# Patient Record
Sex: Male | Born: 1970 | Race: White | Hispanic: No | Marital: Married | Smoking: Current every day smoker
Health system: Southern US, Community
[De-identification: ages and names within clinical notes are randomized; demographics above are authoritative.]

## PROBLEM LIST (undated history)

## (undated) DIAGNOSIS — F1010 Alcohol abuse, uncomplicated: Secondary | ICD-10-CM

## (undated) DIAGNOSIS — I10 Essential (primary) hypertension: Secondary | ICD-10-CM

## (undated) DIAGNOSIS — K219 Gastro-esophageal reflux disease without esophagitis: Secondary | ICD-10-CM

## (undated) HISTORY — PX: SHOULDER ARTHROSCOPY: SHX128

## (undated) HISTORY — PX: ARTHROSCOPIC REPAIR ACL: SUR80

---

## 2006-12-28 ENCOUNTER — Ambulatory Visit (HOSPITAL_COMMUNITY): Admission: RE | Admit: 2006-12-28 | Discharge: 2006-12-28 | Payer: Self-pay | Admitting: Family Medicine

## 2010-03-22 ENCOUNTER — Ambulatory Visit (HOSPITAL_COMMUNITY): Admission: RE | Admit: 2010-03-22 | Discharge: 2010-03-22 | Payer: Self-pay | Admitting: Family Medicine

## 2012-09-17 ENCOUNTER — Other Ambulatory Visit: Payer: Self-pay | Admitting: Nurse Practitioner

## 2012-09-17 MED ORDER — OMEPRAZOLE 20 MG PO CPDR: 20.0000 mg | DELAYED_RELEASE_CAPSULE | Freq: Every day | ORAL | Status: DC

## 2012-09-17 MED ORDER — CITALOPRAM HYDROBROMIDE 20 MG PO TABS: 20.0000 mg | ORAL_TABLET | Freq: Every day | ORAL | Status: DC

## 2012-09-17 MED ORDER — LOSARTAN POTASSIUM 50 MG PO TABS: 50.0000 mg | ORAL_TABLET | Freq: Every day | ORAL | Status: DC

## 2012-11-27 ENCOUNTER — Other Ambulatory Visit: Payer: Self-pay | Admitting: Nurse Practitioner

## 2012-11-27 NOTE — Telephone Encounter (Signed)
Ok times 4 total 

## 2012-11-27 NOTE — Telephone Encounter (Signed)
RX called into pharmacy

## 2013-01-01 ENCOUNTER — Other Ambulatory Visit: Payer: Self-pay | Admitting: Nurse Practitioner

## 2013-02-20 ENCOUNTER — Other Ambulatory Visit: Payer: Self-pay | Admitting: *Deleted

## 2013-02-20 MED ORDER — OMEPRAZOLE 20 MG PO CPDR: 20.0000 mg | DELAYED_RELEASE_CAPSULE | Freq: Every day | ORAL | Status: DC

## 2013-02-20 MED ORDER — LOSARTAN POTASSIUM 50 MG PO TABS: 50.0000 mg | ORAL_TABLET | Freq: Every day | ORAL | Status: DC

## 2013-03-22 ENCOUNTER — Ambulatory Visit (INDEPENDENT_AMBULATORY_CARE_PROVIDER_SITE_OTHER): Payer: Managed Care, Other (non HMO) | Admitting: Nurse Practitioner

## 2013-03-22 ENCOUNTER — Encounter: Payer: Self-pay | Admitting: Nurse Practitioner

## 2013-03-22 VITALS — BP 150/90 | Ht 70.0 in | Wt 216.0 lb

## 2013-03-22 DIAGNOSIS — K7689 Other specified diseases of liver: Secondary | ICD-10-CM

## 2013-03-22 DIAGNOSIS — R748 Abnormal levels of other serum enzymes: Secondary | ICD-10-CM

## 2013-03-22 DIAGNOSIS — M25569 Pain in unspecified knee: Secondary | ICD-10-CM

## 2013-03-22 DIAGNOSIS — I10 Essential (primary) hypertension: Secondary | ICD-10-CM

## 2013-03-22 DIAGNOSIS — E785 Hyperlipidemia, unspecified: Secondary | ICD-10-CM

## 2013-03-22 DIAGNOSIS — Z23 Encounter for immunization: Secondary | ICD-10-CM

## 2013-03-22 DIAGNOSIS — K760 Fatty (change of) liver, not elsewhere classified: Secondary | ICD-10-CM

## 2013-03-22 DIAGNOSIS — G8929 Other chronic pain: Secondary | ICD-10-CM

## 2013-03-22 MED ORDER — HYDROCODONE-ACETAMINOPHEN 10-325 MG PO TABS: 1.0000 | ORAL_TABLET | Freq: Four times a day (QID) | ORAL | Status: DC | PRN

## 2013-03-25 ENCOUNTER — Encounter: Payer: Self-pay | Admitting: Nurse Practitioner

## 2013-03-25 DIAGNOSIS — G8929 Other chronic pain: Secondary | ICD-10-CM | POA: Insufficient documentation

## 2013-03-25 DIAGNOSIS — E785 Hyperlipidemia, unspecified: Secondary | ICD-10-CM | POA: Insufficient documentation

## 2013-03-25 DIAGNOSIS — I10 Essential (primary) hypertension: Secondary | ICD-10-CM | POA: Insufficient documentation

## 2013-03-25 DIAGNOSIS — R748 Abnormal levels of other serum enzymes: Secondary | ICD-10-CM | POA: Insufficient documentation

## 2013-03-25 DIAGNOSIS — K760 Fatty (change of) liver, not elsewhere classified: Secondary | ICD-10-CM | POA: Insufficient documentation

## 2013-03-25 NOTE — Progress Notes (Signed)
Subjective:  Presents for recheck on his pressure. Has not had his medication today. Was out of his medication for about 2 weeks. Working 60-72 hours per week all summer. No chest pain shortness of breath or edema. Complaints of right knee pain for the past 2 weeks. Began with all the bending and stooping he is doing at work with over time. Has had 2 surgeries on this knee. Pain will wake him up at nighttime. Taking 4 Advil about every 4 hours. Mild edema. Has not had his blood work done in a while. Has not seen a GI specialist for fatty liver disease and elevated liver enzymes.  Objective:   BP 150/90  Ht 5\' 10"  (1.778 m)  Wt 216 lb (97.977 kg)  BMI 30.99 kg/m2 NAD. Alert, oriented. Lungs clear. Heart regular rate rhythm. No murmur or gallop noted. Carotids no bruits or thrills. Lower extremities no edema. Right knee minimal edema. No crepitus noted. No joint laxity. Pain with full extension and flexion. Tenderness noted along the anterior lateral aspect of the knee.   Assessment:Essential hypertension, benign - Plan: Basic metabolic panel  Need for prophylactic vaccination with combined diphtheria-tetanus-pertussis (DTP) vaccine - Plan: Dt vaccine greater than 7yo IM  Fatty liver disease, nonalcoholic - Plan: Hepatic function panel, Lipid panel  Elevated liver enzymes - Plan: Hepatic function panel  Hyperlipemia - Plan: Lipid panel  Knee pain, chronic, right  Plan: Meds ordered this encounter  Medications  . HYDROcodone-acetaminophen (NORCO) 10-325 MG per tablet    Sig: Take 1 tablet by mouth every 6 (six) hours as needed for pain.    Dispense:  30 tablet    Refill:  0    Order Specific Question:  Supervising Provider    Answer:  Merlyn Albert [[id number]]   Since patient's overtime hours are finished, recommend that he call his orthopedic specialist and set up an appointment as soon as possible. Explained that use of narcotic pain medicine is intended for limited time until he can get  appointment for recheck. Followup in 3-4 months, sooner if needed.

## 2013-03-28 ENCOUNTER — Telehealth: Payer: Self-pay | Admitting: Nurse Practitioner

## 2013-03-28 NOTE — Telephone Encounter (Signed)
Patient says he spoke with his insurance and they stated they will cover a 90 day supply of his meds, but since he is down to 3 pills he would like a 30 day refill to [facility] in [place]. He needs losartan, omeprazole, and hydrochlorothiazide.

## 2013-03-29 ENCOUNTER — Other Ambulatory Visit: Payer: Self-pay | Admitting: Nurse Practitioner

## 2013-03-29 ENCOUNTER — Other Ambulatory Visit: Payer: Self-pay

## 2013-03-29 MED ORDER — OMEPRAZOLE 20 MG PO CPDR: 20.0000 mg | DELAYED_RELEASE_CAPSULE | Freq: Every day | ORAL | Status: DC

## 2013-03-29 MED ORDER — CITALOPRAM HYDROBROMIDE 20 MG PO TABS: 20.0000 mg | ORAL_TABLET | Freq: Every day | ORAL | Status: DC

## 2013-03-29 MED ORDER — HYDROCHLOROTHIAZIDE 25 MG PO TABS: ORAL_TABLET | ORAL | Status: DC

## 2013-03-29 MED ORDER — LOSARTAN POTASSIUM 50 MG PO TABS: 50.0000 mg | ORAL_TABLET | Freq: Every day | ORAL | Status: DC

## 2013-03-29 NOTE — Telephone Encounter (Signed)
Need to know what company does his 90 day supply so I can send in order. In the meantime, I will send in 30 d supply as requested.

## 2013-03-29 NOTE — Telephone Encounter (Signed)
Wants printed to mail in

## 2013-04-27 LAB — BASIC METABOLIC PANEL
BUN: 11 mg/dL (ref 6–23)
CO2: 25 mEq/L (ref 19–32)
Calcium: 9.3 mg/dL (ref 8.4–10.5)
Creat: 0.83 mg/dL (ref 0.50–1.35)
Glucose, Bld: 116 mg/dL — ABNORMAL HIGH (ref 70–99)
Sodium: 137 mEq/L (ref 135–145)

## 2013-04-27 LAB — LIPID PANEL
Cholesterol: 190 mg/dL (ref 0–200)
HDL: 34 mg/dL — ABNORMAL LOW (ref >39)

## 2013-04-27 LAB — HEPATIC FUNCTION PANEL
Albumin: 4.1 g/dL (ref 3.5–5.2)
Alkaline Phosphatase: 56 U/L (ref 39–117)
Total Bilirubin: 0.8 mg/dL (ref 0.3–1.2)
Total Protein: 6.7 g/dL (ref 6.0–8.3)

## 2013-05-08 ENCOUNTER — Encounter: Payer: Self-pay | Admitting: Nurse Practitioner

## 2013-05-08 DIAGNOSIS — R7301 Impaired fasting glucose: Secondary | ICD-10-CM | POA: Insufficient documentation

## 2013-05-08 DIAGNOSIS — R7989 Other specified abnormal findings of blood chemistry: Secondary | ICD-10-CM | POA: Insufficient documentation

## 2013-07-04 ENCOUNTER — Other Ambulatory Visit: Payer: Self-pay | Admitting: Nurse Practitioner

## 2013-08-15 ENCOUNTER — Other Ambulatory Visit: Payer: Self-pay | Admitting: Nurse Practitioner

## 2013-08-15 ENCOUNTER — Other Ambulatory Visit: Payer: Self-pay | Admitting: Family Medicine

## 2013-08-16 NOTE — Telephone Encounter (Signed)
It is okay to prove this x3 he will need an office visit before further

## 2013-12-16 ENCOUNTER — Telehealth: Payer: Self-pay | Admitting: Family Medicine

## 2013-12-16 NOTE — Telephone Encounter (Signed)
Rx APPROVED for pt's VIAGRA 50mg  tablet, #8/30days, expires 12/05/2014 through pt's [employer] coverage, faxed approval to [facility]

## 2013-12-25 ENCOUNTER — Other Ambulatory Visit: Payer: Self-pay | Admitting: Family Medicine

## 2013-12-25 NOTE — Telephone Encounter (Signed)
May have 6 refills on both standard medicines in for refills on Xanax

## 2014-05-07 ENCOUNTER — Other Ambulatory Visit: Payer: Self-pay | Admitting: Nurse Practitioner

## 2014-06-23 ENCOUNTER — Other Ambulatory Visit: Payer: Self-pay | Admitting: Family Medicine

## 2014-06-23 NOTE — Telephone Encounter (Signed)
Whoa not seen for fifteen months, can't do also very late on htn f u

## 2014-08-15 ENCOUNTER — Other Ambulatory Visit: Payer: Self-pay | Admitting: Family Medicine

## 2014-09-24 ENCOUNTER — Other Ambulatory Visit: Payer: Self-pay | Admitting: Nurse Practitioner

## 2014-09-24 ENCOUNTER — Other Ambulatory Visit: Payer: Self-pay | Admitting: Family Medicine

## 2014-09-29 ENCOUNTER — Ambulatory Visit (INDEPENDENT_AMBULATORY_CARE_PROVIDER_SITE_OTHER): Payer: Managed Care, Other (non HMO) | Admitting: Family Medicine

## 2014-09-29 ENCOUNTER — Encounter: Payer: Self-pay | Admitting: Family Medicine

## 2014-09-29 VITALS — BP 180/88 | Ht 70.0 in | Wt 206.0 lb

## 2014-09-29 DIAGNOSIS — I10 Essential (primary) hypertension: Secondary | ICD-10-CM

## 2014-09-29 MED ORDER — HYDROCHLOROTHIAZIDE 25 MG PO TABS: ORAL_TABLET | ORAL | Status: DC

## 2014-09-29 MED ORDER — LOSARTAN POTASSIUM 100 MG PO TABS: 100.0000 mg | ORAL_TABLET | Freq: Every day | ORAL | Status: DC

## 2014-09-29 NOTE — Progress Notes (Signed)
   Subjective:    Patient ID: Jamie Chavez, male    DOB: 1971/06/16, 44 y.o.   MRN: [id number]  Hypertension This is a chronic problem. Treatments tried: losartan and hctz.    Patient has had some issues with compliance of medication. Now claims compliance.  Blood pressure was noted to be very high elsewhere and was sent here.  No chest pain no shortness of breath  Review of Systems No vomiting no diarrhea no loss of consciousness    Objective:   Physical Exam Blood pressure on repeat 192/94. HEENT normal. Lungs clear heart regular in rhythm no acute distress ankles without edema       Assessment & Plan:  Impression hypertension poor control question compliance plan increase losartan component. Maintain other meds follow-up as scheduled WSL

## 2014-10-14 ENCOUNTER — Other Ambulatory Visit: Payer: Self-pay | Admitting: Family Medicine

## 2014-10-31 ENCOUNTER — Encounter: Payer: Self-pay | Admitting: Family Medicine

## 2014-10-31 ENCOUNTER — Ambulatory Visit (INDEPENDENT_AMBULATORY_CARE_PROVIDER_SITE_OTHER): Payer: Managed Care, Other (non HMO) | Admitting: Family Medicine

## 2014-10-31 VITALS — BP 164/104 | Ht 70.0 in | Wt 211.5 lb

## 2014-10-31 DIAGNOSIS — I10 Essential (primary) hypertension: Secondary | ICD-10-CM

## 2014-10-31 MED ORDER — HYDROCHLOROTHIAZIDE 25 MG PO TABS: ORAL_TABLET | ORAL | Status: DC

## 2014-10-31 MED ORDER — LOSARTAN POTASSIUM 100 MG PO TABS: 100.0000 mg | ORAL_TABLET | Freq: Every day | ORAL | Status: DC

## 2014-10-31 NOTE — Progress Notes (Signed)
   Subjective:    Patient ID: Jamie Chavez, male    DOB: 14-Nov-1970, 44 y.o.   MRN: [id number]  Hypertension This is a chronic problem. The current episode started more than 1 year ago. The problem has been gradually improving since onset. There are no associated agents to hypertension. There are no known risk factors for coronary artery disease. Treatments tried: losartan, hctz. The current treatment provides moderate improvement. There are no compliance problems.    Patient states that he has joint pain all the time. This has been present for about 6-8 months now. Patient has knots on his knuckles that he is concerned about also.   Out of hctz for five or six days   Pt ck'ed up last night,  Pt has hx of knee surg  Notes nodules on hands concern he may have substantial arthritis.  Some increased stress at work recently      Review of Systems No headache no chest pain no back pain no change in bowel habits    Objective:   Physical Exam  Alert vitals stable lungs clear. Heart regular in rhythm H&T normal. Hands joints normal. No Heberden's nodes. No synovial swelling.     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 Recheck in several months. Diet exercise discussed WSL

## 2014-12-25 ENCOUNTER — Other Ambulatory Visit: Payer: Self-pay | Admitting: Family Medicine

## 2014-12-26 NOTE — Telephone Encounter (Signed)
Omeprazole 6 month, xanax 3 rf

## 2015-01-01 ENCOUNTER — Other Ambulatory Visit: Payer: Self-pay | Admitting: Family Medicine

## 2015-01-02 ENCOUNTER — Telehealth: Payer: Self-pay | Admitting: Family Medicine

## 2015-01-02 NOTE — Telephone Encounter (Signed)
May have this +1 refill 

## 2015-01-02 NOTE — Telephone Encounter (Signed)
Called patient and informed him that I called [facility] and refill for Hydrochlorothiazide is ready for pickup. Also informed patient that Xanax prescription was faxed over to Pharmacy. Patient verbalized understanding.

## 2015-01-02 NOTE — Telephone Encounter (Signed)
hydrochlorothiazide (HYDRODIURIL) 25 MG tablet  Pt needs refill on this, [facility] need a [facility] from  [place] but I don't see anything from them requesting a refill?  Can we fill this as soon as possible, he has been out 2 wks now and his  bp is around 180/158

## 2015-01-09 ENCOUNTER — Telehealth: Payer: Self-pay | Admitting: Family Medicine

## 2015-01-09 NOTE — Telephone Encounter (Signed)
Rx prior auth APPROVED for pt's VIAGRA 50 MG tablet, Ref# [id number], approved for #8/30days, valid 01/09/15-01/09/16, faxed approval to [facility]

## 2015-01-23 ENCOUNTER — Ambulatory Visit: Payer: Managed Care, Other (non HMO) | Admitting: Family Medicine

## 2015-04-03 ENCOUNTER — Other Ambulatory Visit: Payer: Self-pay | Admitting: Family Medicine

## 2015-04-06 NOTE — Telephone Encounter (Signed)
They have this and 2 refills

## 2015-08-11 ENCOUNTER — Other Ambulatory Visit: Payer: Self-pay | Admitting: Family Medicine

## 2015-08-11 NOTE — Telephone Encounter (Signed)
Ok both times one only. ocontact pt, saw in may and rec a three mo f u with high bl pressure, needs o v

## 2015-09-20 ENCOUNTER — Other Ambulatory Visit: Payer: Self-pay | Admitting: Family Medicine

## 2015-09-21 ENCOUNTER — Other Ambulatory Visit: Payer: Self-pay | Admitting: Family Medicine

## 2015-09-21 NOTE — Telephone Encounter (Signed)
Ok times one see other note, needs o v

## 2015-09-21 NOTE — Telephone Encounter (Signed)
Ok times one needs f u o v

## 2015-09-23 ENCOUNTER — Other Ambulatory Visit: Payer: Self-pay | Admitting: Family Medicine

## 2015-09-24 ENCOUNTER — Other Ambulatory Visit: Payer: Self-pay | Admitting: Family Medicine

## 2015-10-05 ENCOUNTER — Encounter: Payer: Self-pay | Admitting: Nurse Practitioner

## 2015-10-05 ENCOUNTER — Ambulatory Visit (INDEPENDENT_AMBULATORY_CARE_PROVIDER_SITE_OTHER): Payer: 59 | Admitting: Nurse Practitioner

## 2015-10-05 VITALS — BP 156/102 | Ht 70.0 in | Wt 208.4 lb

## 2015-10-05 DIAGNOSIS — M25561 Pain in right knee: Secondary | ICD-10-CM | POA: Diagnosis not present

## 2015-10-05 DIAGNOSIS — Z139 Encounter for screening, unspecified: Secondary | ICD-10-CM | POA: Diagnosis not present

## 2015-10-05 DIAGNOSIS — I10 Essential (primary) hypertension: Secondary | ICD-10-CM | POA: Diagnosis not present

## 2015-10-05 DIAGNOSIS — R7301 Impaired fasting glucose: Secondary | ICD-10-CM | POA: Diagnosis not present

## 2015-10-05 DIAGNOSIS — E785 Hyperlipidemia, unspecified: Secondary | ICD-10-CM

## 2015-10-05 DIAGNOSIS — R7989 Other specified abnormal findings of blood chemistry: Secondary | ICD-10-CM | POA: Diagnosis not present

## 2015-10-05 DIAGNOSIS — M2550 Pain in unspecified joint: Secondary | ICD-10-CM

## 2015-10-05 DIAGNOSIS — K760 Fatty (change of) liver, not elsewhere classified: Secondary | ICD-10-CM | POA: Diagnosis not present

## 2015-10-05 DIAGNOSIS — G8929 Other chronic pain: Secondary | ICD-10-CM

## 2015-10-05 MED ORDER — MELOXICAM 15 MG PO TABS: 15.0000 mg | ORAL_TABLET | Freq: Every day | ORAL | Status: DC

## 2015-10-05 NOTE — Progress Notes (Signed)
Subjective:  Presents for routine follow up. Missed BP pills but has taken for the past 2 days. Diet high in sodium. No CP/ischemic type pain or SOB. Smokes 1 1/2  To 2 ppd. Works at [employer]. BP at work runs around 140/90. Stopped Celexa due to fatigue. Defers need for another med. Rare Xanax only at night; has used 3 since getting it filled. Large caffeine intake; also drinks energy drinks. Sees Dr. Jeral FruitBotero for his back pain. Has seen Dr. Terrilee CroakMortensen for chronic right knee pain; has had 2 surgeries. Has been bothering him lately. Also, has nodules on fingers for several months. Had a grandmother with severe arthritis. His sister comes to our office; she was recently diagnosed with liver disease with hemochromatosis. Unable to miss work due to finances.   Objective:   BP 156/102 mmHg  Ht 5\' 10"  (1.778 m)  Wt 208 lb 6 oz (94.518 kg)  BMI 29.90 kg/m2 NAD. Alert, oriented. Lungs clear. Heart RRR. Carotids: no bruits or thrills. Lower extremities no edema. A few nodules noted on joints in fingers, more on right.   Assessment:  Problem List Items Addressed This Visit      Cardiovascular and Mediastinum   Essential hypertension, benign - Primary   Relevant Orders   Basic metabolic panel     Digestive   Fatty liver disease, nonalcoholic   Relevant Orders   Hepatic function panel   Basic metabolic panel     Endocrine   Impaired fasting glucose   Relevant Orders   Lipid panel     Other   Elevated ferritin level   Hyperlipemia   Knee pain, chronic   Relevant Orders   Ferritin    Other Visit Diagnoses    Arthralgia        Relevant Orders    Rheumatoid factor    Antinuclear Antib (ANA)    Screening        Relevant Orders    Hepatitis C Antibody      Plan:  Meds ordered this encounter  Medications  . meloxicam (MOBIC) 15 MG tablet    Sig: Take 1 tablet (15 mg total) by mouth daily. Prn pain    Dispense:  30 tablet    Refill:  0    Please instruct patient not to take with  OTC NSAIDs    Order Specific Question:  Supervising Provider    Answer:  Merlyn AlbertLUKING, WILLIAM S [[id number]]   Labs pending. Lengthy discussion regarding smoking cessation and effects on BP. Does not feel he can quit. Goal is for him to cut back on smoking to at least less than one pack per day. Reduce sodium and caffeine in diet. Get back with orthopedic doctor about right knee pain. Take meds daily as directed.  Return in about 3 months (around 01/04/2016) for BP recheck.

## 2015-10-05 NOTE — Patient Instructions (Signed)
Glucosamine as directed 

## 2015-10-06 ENCOUNTER — Telehealth: Payer: Self-pay | Admitting: Family Medicine

## 2015-10-06 NOTE — Telephone Encounter (Signed)
Form for prior authorization forwarded to you with highlighted area's needing your attention. Please see form and advise?

## 2015-10-06 NOTE — Telephone Encounter (Signed)
Prior authorization form is for patient's viagra.

## 2015-10-07 ENCOUNTER — Encounter: Payer: Self-pay | Admitting: Family Medicine

## 2015-10-11 LAB — BASIC METABOLIC PANEL
BUN/Creatinine Ratio: 10 (ref 9–20)
BUN: 8 mg/dL (ref 6–24)
CALCIUM: 9.6 mg/dL (ref 8.7–10.2)
CO2: 26 mmol/L (ref 18–29)
CREATININE: 0.84 mg/dL (ref 0.76–1.27)
Chloride: 102 mmol/L (ref 96–106)
GFR, EST AFRICAN AMERICAN: 124 mL/min/{1.73_m2} (ref >59)
GFR, EST NON AFRICAN AMERICAN: 107 mL/min/{1.73_m2} (ref >59)
Glucose: 100 mg/dL — ABNORMAL HIGH (ref 65–99)
Potassium: 4.2 mmol/L (ref 3.5–5.2)
Sodium: 144 mmol/L (ref 134–144)

## 2015-10-11 LAB — HEPATIC FUNCTION PANEL
ALBUMIN: 4.8 g/dL (ref 3.5–5.5)
ALT: 86 IU/L — ABNORMAL HIGH (ref 0–44)
AST: 67 IU/L — AB (ref 0–40)
Alkaline Phosphatase: 67 IU/L (ref 39–117)
BILIRUBIN TOTAL: 0.5 mg/dL (ref 0.0–1.2)
Bilirubin, Direct: 0.14 mg/dL (ref 0.00–0.40)
TOTAL PROTEIN: 7.3 g/dL (ref 6.0–8.5)

## 2015-10-11 LAB — LIPID PANEL
CHOL/HDL RATIO: 4.9 ratio (ref 0.0–5.0)
CHOLESTEROL TOTAL: 193 mg/dL (ref 100–199)
HDL: 39 mg/dL — ABNORMAL LOW (ref >39)
LDL CALC: 120 mg/dL — AB (ref 0–99)
Triglycerides: 171 mg/dL — ABNORMAL HIGH (ref 0–149)
VLDL Cholesterol Cal: 34 mg/dL (ref 5–40)

## 2015-10-11 LAB — FERRITIN: Ferritin: 547 ng/mL — ABNORMAL HIGH (ref 30–400)

## 2015-10-11 LAB — RHEUMATOID FACTOR: Rhuematoid fact SerPl-aCnc: <10 IU/mL (ref 0.0–13.9)

## 2015-10-11 LAB — HEPATITIS C ANTIBODY: Hep C Virus Ab: <0.1 s/co ratio (ref 0.0–0.9)

## 2015-10-11 LAB — ANA: ANA: POSITIVE — AB

## 2015-10-14 NOTE — Telephone Encounter (Signed)
done

## 2015-10-15 ENCOUNTER — Encounter: Payer: Self-pay | Admitting: Nurse Practitioner

## 2015-10-22 ENCOUNTER — Telehealth: Payer: Self-pay | Admitting: *Deleted

## 2015-10-22 MED ORDER — TADALAFIL 20 MG PO TABS: ORAL_TABLET | ORAL | Status: DC

## 2015-10-22 NOTE — Telephone Encounter (Signed)
Swith, 20 mg numb 12 ref prn , one two hrs prior to sex

## 2015-10-22 NOTE — Telephone Encounter (Signed)
[facility]

## 2015-10-22 NOTE — Telephone Encounter (Signed)
Pt prescribed viagra. Pt's plan prefers cialis. Do you want to switch med or do you want us to do medical necessity PA. Form is in nurse's basket.

## 2015-10-26 NOTE — Telephone Encounter (Signed)
Left message to return call 

## 2015-10-29 ENCOUNTER — Other Ambulatory Visit: Payer: Self-pay | Admitting: Family Medicine

## 2015-10-29 NOTE — Telephone Encounter (Signed)
May ref times one 

## 2015-10-30 NOTE — Telephone Encounter (Signed)
Left message to return call 

## 2015-11-02 NOTE — Telephone Encounter (Signed)
Left message to return call 

## 2015-11-09 ENCOUNTER — Other Ambulatory Visit: Payer: Self-pay | Admitting: Family Medicine

## 2015-12-23 ENCOUNTER — Other Ambulatory Visit: Payer: Self-pay | Admitting: Family Medicine

## 2015-12-23 ENCOUNTER — Other Ambulatory Visit: Payer: Self-pay | Admitting: Nurse Practitioner

## 2016-01-15 ENCOUNTER — Ambulatory Visit: Payer: 59 | Admitting: Family Medicine

## 2016-01-18 ENCOUNTER — Encounter: Payer: Self-pay | Admitting: Family Medicine

## 2016-01-18 DIAGNOSIS — Z0289 Encounter for other administrative examinations: Secondary | ICD-10-CM

## 2016-01-20 ENCOUNTER — Other Ambulatory Visit: Payer: Self-pay | Admitting: Family Medicine

## 2016-02-25 ENCOUNTER — Telehealth: Payer: Self-pay | Admitting: *Deleted

## 2016-02-25 NOTE — Telephone Encounter (Signed)
Left message to return call to let pt know plan does not cover viagra. See previous telephone note. Med was change to cialis. Left message to see why pt requested viagra.

## 2016-03-04 NOTE — Telephone Encounter (Signed)
Left message to return call 

## 2016-05-01 ENCOUNTER — Other Ambulatory Visit: Payer: Self-pay | Admitting: Family Medicine

## 2016-08-16 ENCOUNTER — Other Ambulatory Visit: Payer: Self-pay | Admitting: Family Medicine

## 2016-08-16 NOTE — Telephone Encounter (Signed)
Call pt, htn visit over due sched and may give 30 no ref

## 2016-08-16 NOTE — Telephone Encounter (Signed)
Patient last seen April 2017 for hypertension visit.

## 2016-10-11 ENCOUNTER — Other Ambulatory Visit: Payer: Self-pay | Admitting: Family Medicine

## 2016-10-12 ENCOUNTER — Other Ambulatory Visit: Payer: Self-pay | Admitting: Family Medicine

## 2016-10-14 ENCOUNTER — Encounter: Payer: Self-pay | Admitting: Nurse Practitioner

## 2016-10-14 NOTE — Telephone Encounter (Signed)
ERROR

## 2016-10-28 ENCOUNTER — Encounter: Payer: Self-pay | Admitting: Nurse Practitioner

## 2016-10-28 ENCOUNTER — Ambulatory Visit (INDEPENDENT_AMBULATORY_CARE_PROVIDER_SITE_OTHER): Payer: 59 | Admitting: Nurse Practitioner

## 2016-10-28 VITALS — BP 158/104 | Ht 70.0 in | Wt 203.6 lb

## 2016-10-28 DIAGNOSIS — I10 Essential (primary) hypertension: Secondary | ICD-10-CM

## 2016-10-28 DIAGNOSIS — K760 Fatty (change of) liver, not elsewhere classified: Secondary | ICD-10-CM

## 2016-10-28 DIAGNOSIS — K219 Gastro-esophageal reflux disease without esophagitis: Secondary | ICD-10-CM

## 2016-10-28 MED ORDER — ALPRAZOLAM 0.5 MG PO TABS: ORAL_TABLET | ORAL | 1 refills | Status: DC

## 2016-10-28 MED ORDER — PANTOPRAZOLE SODIUM 40 MG PO TBEC
40.0000 mg | DELAYED_RELEASE_TABLET | Freq: Every day | ORAL | 1 refills | Status: DC
Start: 2016-10-28 — End: 2017-09-25

## 2016-10-28 MED ORDER — HYDROCHLOROTHIAZIDE 25 MG PO TABS: ORAL_TABLET | ORAL | 1 refills | Status: DC

## 2016-10-28 MED ORDER — LOSARTAN POTASSIUM 100 MG PO TABS: ORAL_TABLET | ORAL | 1 refills | Status: DC

## 2016-10-29 ENCOUNTER — Encounter: Payer: Self-pay | Admitting: Nurse Practitioner

## 2016-10-29 NOTE — Progress Notes (Signed)
Subjective:  Presents for routine follow up. Will be losing his job and insurance within the next couple of months. Has found a new job but may take awhile for insurance to take effect. No CP/ischemic type pain or SOB. No abdominal pain. Has not had GI work up for elevated liver enzymes, fatty liver disease and elevated ferritin level. Limited alcohol intake. Insurance no longer covers omeprazole. Has run out of BP meds. Not on meds today. Defers need for Celexa. Takes rare Xanax.   Objective:   BP (!) 158/104 Comment: has not taken bp meds. ran out  Ht 5\' 10"  (1.778 m)   Wt 203 lb 9.6 oz (92.4 kg)   BMI 29.21 kg/m  NAD. Alert, oriented. Lungs clear. Heart RRR. Abdomen soft, non distended, non tender. LE: no edema.   Assessment:   Problem List Items Addressed This Visit      Cardiovascular and Mediastinum   Essential hypertension, benign - Primary   Relevant Medications   hydrochlorothiazide (HYDRODIURIL) 25 MG tablet   losartan (COZAAR) 100 MG tablet     Digestive   Fatty liver disease, nonalcoholic    Other Visit Diagnoses    Gastroesophageal reflux disease without esophagitis       Relevant Medications   pantoprazole (PROTONIX) 40 MG tablet       Plan:   Meds ordered this encounter  Medications  . ALPRAZolam (XANAX) 0.5 MG tablet    Sig: TAKE ONE-HALF TO ONE TABLET BY MOUTH TWICE DAILY AS NEEDED    Dispense:  60 tablet    Refill:  1    Order Specific Question:   Supervising Provider    Answer:   Merlyn AlbertLUKING, WILLIAM S [[id number]]  . hydrochlorothiazide (HYDRODIURIL) 25 MG tablet    Sig: TAKE ONE TABLET BY MOUTH ONCE DAILY IN THE MORNING    Dispense:  90 tablet    Refill:  1    Order Specific Question:   Supervising Provider    Answer:   Merlyn AlbertLUKING, WILLIAM S [[id number]]  . losartan (COZAAR) 100 MG tablet    Sig: TAKE 1 TABLET BY MOUTH ONCE DAILY    Dispense:  90 tablet    Refill:  1    Needs office visit for additional refills    Order Specific Question:   Supervising Provider   Answer:   Merlyn AlbertLUKING, WILLIAM S [[id number]]  . pantoprazole (PROTONIX) 40 MG tablet    Sig: Take 1 tablet (40 mg total) by mouth daily.    Dispense:  90 tablet    Refill:  1    Order Specific Question:   Supervising Provider    Answer:   Merlyn AlbertLUKING, WILLIAM S [[id number]]   Restart BP meds. Switched to [employer] for insurance coverage. If unable to afford, switch to OTC Omeprazole.  Recommend follow up once he gets new insurance including repeat labs and possible referral to GI. Patient agrees with this plan.

## 2017-03-22 ENCOUNTER — Other Ambulatory Visit: Payer: Self-pay | Admitting: Nurse Practitioner

## 2017-09-05 ENCOUNTER — Telehealth: Payer: Self-pay | Admitting: Family Medicine

## 2017-09-05 ENCOUNTER — Other Ambulatory Visit: Payer: Self-pay | Admitting: *Deleted

## 2017-09-05 MED ORDER — VERAPAMIL HCL ER 240 MG PO TBCR: 240.0000 mg | EXTENDED_RELEASE_TABLET | Freq: Every day | ORAL | 1 refills | Status: DC

## 2017-09-05 NOTE — Telephone Encounter (Signed)
Verapamil 240 sr qd/stap losartan  30 d with one ref, quite late for six mo f u visit before finished

## 2017-09-05 NOTE — Telephone Encounter (Signed)
Pt's LOSARTAN is listed on the [facility] list of the affected manufacturers   Pt wonders if we should renew the Rx or change med? Hasn't taken in about 10 days due to ran out & couldn't refill until got new insurance card  Please advise - OK to LMOVM - pt going to work soon    [facility] [place]

## 2017-09-05 NOTE — Telephone Encounter (Signed)
Left message to return call. New med sent to pharm.

## 2017-09-06 NOTE — Telephone Encounter (Signed)
Discussed with pt. Pt wants to know does he continue taking hctz with the new bp med.

## 2017-09-07 ENCOUNTER — Other Ambulatory Visit: Payer: Self-pay | Admitting: Nurse Practitioner

## 2017-09-07 NOTE — Telephone Encounter (Signed)
Ok times one mo earlier this wk I said needs chronic appt

## 2017-09-07 NOTE — Telephone Encounter (Signed)
Patient is aware of all and states he will call back for an appt.

## 2017-09-07 NOTE — Telephone Encounter (Signed)
Yes, tho visit as requested will help sort this out asw we ck pt on both meds

## 2017-09-07 NOTE — Telephone Encounter (Signed)
Left message to return call 

## 2017-09-21 ENCOUNTER — Telehealth: Payer: Self-pay | Admitting: Family Medicine

## 2017-09-21 ENCOUNTER — Other Ambulatory Visit: Payer: Self-pay | Admitting: Family Medicine

## 2017-09-21 NOTE — Telephone Encounter (Signed)
Nurses-please call his pharmacy find out if valsartan generic is available 160 mg tablet 1 daily?

## 2017-09-21 NOTE — Telephone Encounter (Signed)
It would be wise for this patient to be seen face-to-face I recommend a office visit for tomorrow

## 2017-09-21 NOTE — Telephone Encounter (Signed)
Nurses please talk with the patient if he is interested we can try valsartan or lisinopril in place of the verapamil.  Valsartan and lisinopril are both medications very similar to the losartan.  We have called [facility] and they have both of these available.  If he is interested we can switch to 1 of these medications have him use it in place of verapamil and follow-up with Dr. Brett CanalesSteve next week-patient to let us know

## 2017-09-21 NOTE — Telephone Encounter (Signed)
Spoke with patient; states he is unable to come to office tomorrow due to work. Will get appt to be seen Monday

## 2017-09-21 NOTE — Telephone Encounter (Signed)
Contacted pts pharmacy [facility] and valsartan 160mg  is available. Would you like me to order

## 2017-09-21 NOTE — Telephone Encounter (Signed)
Pt contacted office stating that BP was up. States his BP before taking the med was around 141/89 but with the new med is has increased to around 16/70-170/110. States he takes his BP meds around noon. He is extremely tried and was having headaches but they have eased off. He is taking hydrochlorothiazide 25 and verapamil 240. Please advise.

## 2017-09-22 ENCOUNTER — Other Ambulatory Visit: Payer: Self-pay | Admitting: *Deleted

## 2017-09-22 MED ORDER — VALSARTAN 160 MG PO TABS: 160.0000 mg | ORAL_TABLET | Freq: Every day | ORAL | 2 refills | Status: DC

## 2017-09-22 NOTE — Telephone Encounter (Signed)
Discontinue verapamil.  Start valsartan 160 mg 1 daily, #30, 2 refills.  Please keep follow-up visit.  Please make changes in epic

## 2017-09-22 NOTE — Telephone Encounter (Signed)
Left detailed message on voicemail. Med sent to pharm.

## 2017-09-22 NOTE — Telephone Encounter (Signed)
Spoke with patient and is he willing to try Valsartan. He has an appt Monday with Dr. Brett CanalesSteve. [facility] [place] is pts pharmacy

## 2017-09-22 NOTE — Telephone Encounter (Signed)
Left message to return call 

## 2017-09-25 ENCOUNTER — Ambulatory Visit: Payer: 59 | Admitting: Family Medicine

## 2017-09-25 ENCOUNTER — Encounter: Payer: Self-pay | Admitting: Family Medicine

## 2017-09-25 VITALS — BP 168/88 | Ht 70.0 in | Wt 187.0 lb

## 2017-09-25 DIAGNOSIS — F411 Generalized anxiety disorder: Secondary | ICD-10-CM | POA: Diagnosis not present

## 2017-09-25 DIAGNOSIS — J329 Chronic sinusitis, unspecified: Secondary | ICD-10-CM

## 2017-09-25 DIAGNOSIS — I10 Essential (primary) hypertension: Secondary | ICD-10-CM

## 2017-09-25 MED ORDER — HYDROCHLOROTHIAZIDE 25 MG PO TABS: ORAL_TABLET | ORAL | 1 refills | Status: DC

## 2017-09-25 MED ORDER — BUPROPION HCL ER (SR) 150 MG PO TB12: 150.0000 mg | ORAL_TABLET | Freq: Two times a day (BID) | ORAL | 0 refills | Status: DC

## 2017-09-25 MED ORDER — VALSARTAN 160 MG PO TABS: 160.0000 mg | ORAL_TABLET | Freq: Every day | ORAL | 1 refills | Status: DC

## 2017-09-25 MED ORDER — AMOXICILLIN-POT CLAVULANATE 875-125 MG PO TABS: 1.0000 | ORAL_TABLET | Freq: Two times a day (BID) | ORAL | 0 refills | Status: DC

## 2017-09-25 NOTE — Progress Notes (Signed)
   Subjective:    Patient ID: Jamie Chavez, male    DOB: 31-Jan-1971, 47 y.o.   MRN: [id number]  patient arrives with numerous concerns HPI Patient here today states there was a recall on his Losartan.He was changed to Valsartan 160 mg and states it made bp go up higher. He is taking it.  Blood pressure medicine and blood pressure levels reviewed today with patient. Compliant with blood pressure medicine. States does not miss a dose. No obvious side effects. Blood pressure generally good when checked elsewhere. Watching salt intake.  Intermittent ear fullness 2 months  intermittent nasal congestion productive nasal discharge right ear fullness popping  Progressive anxiety.  A lot of stresses recently.  Lost job last year.  Facing challenges financially.  No suicidal or homicidal thoughts  Review of Systems No headache, no major weight loss or weight gain, no chest pain no back pain abdominal pain no change in bowel habits complete ROS otherwise negative     Objective:   Physical Exam  Alert and oriented, vitals reviewed and stable, NAD ENT-TM's and ext canals WNL bilat via otoscopic exam Soft palate, tonsils and post pharynx WNL via oropharyngeal exam Neck-symmetric, no masses; thyroid nonpalpable and nontender Pulmonary-no tachypnea or accessory muscle use; Clear without wheezes via auscultation Card--no abnrml murmurs, rhythm reg and rate WNL Carotid pulses symmetric, without bruits Impression     Assessment & Plan:  Hypertension.  Discussed at length.  Controlled.  To maintain same medication.  Generalized anxiety disorder.  Uses as needed Xanax.  Would like to consider daily treatment.  Wellbutrin discussed side effects benefits discussed  Subacute rhinosinusitis antibiotics prescribed symptom care discussed

## 2017-11-04 ENCOUNTER — Other Ambulatory Visit: Payer: Self-pay | Admitting: Family Medicine

## 2017-11-06 NOTE — Telephone Encounter (Signed)
Six mo ok 

## 2018-03-26 ENCOUNTER — Ambulatory Visit: Payer: 59 | Admitting: Family Medicine

## 2018-03-26 DIAGNOSIS — Z029 Encounter for administrative examinations, unspecified: Secondary | ICD-10-CM

## 2018-03-27 ENCOUNTER — Ambulatory Visit: Payer: 59 | Admitting: Family Medicine

## 2018-04-17 ENCOUNTER — Encounter: Payer: Self-pay | Admitting: Family Medicine

## 2018-06-30 ENCOUNTER — Other Ambulatory Visit: Payer: Self-pay | Admitting: Family Medicine

## 2018-07-02 NOTE — Telephone Encounter (Signed)
30 d only late for 6 mo ck up, needs o v before further

## 2018-08-22 ENCOUNTER — Other Ambulatory Visit: Payer: Self-pay | Admitting: Family Medicine

## 2018-08-22 NOTE — Telephone Encounter (Signed)
sched appt, thirty days only of meds

## 2018-08-22 NOTE — Telephone Encounter (Signed)
Left message to return call 

## 2018-10-01 ENCOUNTER — Encounter: Payer: Self-pay | Admitting: Family Medicine

## 2018-10-01 ENCOUNTER — Other Ambulatory Visit: Payer: Self-pay

## 2018-10-01 ENCOUNTER — Ambulatory Visit (INDEPENDENT_AMBULATORY_CARE_PROVIDER_SITE_OTHER): Payer: 59 | Admitting: Family Medicine

## 2018-10-01 DIAGNOSIS — F411 Generalized anxiety disorder: Secondary | ICD-10-CM | POA: Diagnosis not present

## 2018-10-01 DIAGNOSIS — I10 Essential (primary) hypertension: Secondary | ICD-10-CM

## 2018-10-01 DIAGNOSIS — F5101 Primary insomnia: Secondary | ICD-10-CM | POA: Diagnosis not present

## 2018-10-01 NOTE — Progress Notes (Addendum)
   Subjective:    Patient ID: Jamie Chavez, male    DOB: 12-06-1970, 48 y.o.   MRN: [id number] Audio visit only Hypertension  This is a chronic problem. Risk factors for coronary artery disease include male gender. Treatments tried: hctz, valsartan. There are no compliance problems.    Blood pressure medicine and blood pressure levels reviewed today with patient. Compliant with blood pressure medicine. States does not miss a dose. No obvious side effects. Blood pressure generally good when checked elsewhere. Watching salt intake.  bp med sticking with it, overall numbers good  Patient continues to have problems with anxiety and depression.  No suicidal thoughts.  Lost his mother and a sister within the last year.  Has gone through divorce.  Currently not involved in another relationship.  Finds his mind focusing on the negatives often.  Wellbutrin just did not help   Review of Systems   Virtual Visit via Video Note  I connected with Jamie Chavez on 10/01/18 at 10:30 AM EDT by a video enabled telemedicine application and verified that I am speaking with the correct person using two identifiers.   I discussed the limitations of evaluation and management by telemedicine and the availability of in person appointments. The patient expressed understanding and agreed to proceed.  History of Present Illness:    Observations/Objective:   Assessment and Plan:   Follow Up Instructions:    I discussed the assessment and treatment plan with the patient. The patient was provided an opportunity to ask questions and all were answered. The patient agreed with the plan and demonstrated an understanding of the instructions.   The patient was advised to call back or seek an in-person evaluation if the symptoms worsen or if the condition fails to improve as anticipated.  I provided25  minutes of non-face-to-face time during this encounter.         Objective:   Physical Exam   Virtual exam      Assessment & Plan:  Impression 1 hypertension.  Continue same meds diet exercise discussed compliance discussed  2.  Depression/anxiety suboptimal result.  Discussed.  Hold on Wellbutrin.  Start Lexapro rationale discussed.  3.  Insomnia ongoing Xanax as needed.  Behavioral modification of negative thoughts discussed  Follow-up in 6 months diet exercise discussed and encouraged No blood work at this time

## 2018-10-08 ENCOUNTER — Telehealth: Payer: Self-pay | Admitting: Family Medicine

## 2018-10-08 MED ORDER — ESCITALOPRAM OXALATE 10 MG PO TABS: 10.0000 mg | ORAL_TABLET | Freq: Every day | ORAL | 5 refills | Status: DC

## 2018-10-08 NOTE — Telephone Encounter (Signed)
Patient was seen 4/8 and was suppose to get a prescription of low dose of lexapro called into [facility]  -[place]

## 2018-10-08 NOTE — Telephone Encounter (Signed)
lexapro 10 one dail six mo worth

## 2018-10-08 NOTE — Telephone Encounter (Signed)
Prescription sent electronically to pharmacy. Patient notified. 

## 2018-10-14 ENCOUNTER — Other Ambulatory Visit: Payer: Self-pay | Admitting: Family Medicine

## 2018-10-15 NOTE — Telephone Encounter (Signed)
Six mo all 

## 2019-05-25 ENCOUNTER — Other Ambulatory Visit: Payer: Self-pay | Admitting: Family Medicine

## 2019-05-27 NOTE — Telephone Encounter (Signed)
Please schedule and then route back to nurses to send in refill °

## 2019-05-27 NOTE — Telephone Encounter (Signed)
One mo, sched six mo ck up

## 2019-05-28 NOTE — Telephone Encounter (Signed)
Lvm to schedule virtual visit  

## 2019-06-05 ENCOUNTER — Other Ambulatory Visit: Payer: Self-pay | Admitting: Family Medicine

## 2019-06-05 NOTE — Telephone Encounter (Signed)
6 mo ok 

## 2019-07-06 ENCOUNTER — Other Ambulatory Visit: Payer: Self-pay | Admitting: Family Medicine

## 2019-07-08 NOTE — Telephone Encounter (Signed)
Please contact patient to set up appt; then may route to nurses. Thank you 

## 2019-07-09 NOTE — Telephone Encounter (Signed)
lvm to schedule virtual   

## 2019-07-10 NOTE — Telephone Encounter (Signed)
lvm to schedule virtual   

## 2019-07-11 NOTE — Telephone Encounter (Signed)
Scheduled 12/6

## 2019-07-16 ENCOUNTER — Other Ambulatory Visit: Payer: Self-pay

## 2019-07-16 ENCOUNTER — Ambulatory Visit (INDEPENDENT_AMBULATORY_CARE_PROVIDER_SITE_OTHER): Payer: 59 | Admitting: Family Medicine

## 2019-07-16 DIAGNOSIS — F5101 Primary insomnia: Secondary | ICD-10-CM

## 2019-07-16 DIAGNOSIS — F411 Generalized anxiety disorder: Secondary | ICD-10-CM | POA: Diagnosis not present

## 2019-07-16 DIAGNOSIS — I10 Essential (primary) hypertension: Secondary | ICD-10-CM | POA: Diagnosis not present

## 2019-07-16 MED ORDER — HYDROCHLOROTHIAZIDE 25 MG PO TABS: ORAL_TABLET | ORAL | 1 refills | Status: DC

## 2019-07-16 MED ORDER — ESCITALOPRAM OXALATE 20 MG PO TABS: ORAL_TABLET | ORAL | 5 refills | Status: DC

## 2019-07-16 MED ORDER — VALSARTAN 160 MG PO TABS: 160.0000 mg | ORAL_TABLET | Freq: Every day | ORAL | 1 refills | Status: DC

## 2019-07-16 MED ORDER — ALPRAZOLAM 0.5 MG PO TABS: ORAL_TABLET | ORAL | 5 refills | Status: DC

## 2019-07-16 NOTE — Progress Notes (Signed)
   Subjective:    Patient ID: Jamie Chavez, male    DOB: 07-11-1970, 49 y.o.   MRN: [id number]  Hypertension This is a chronic problem. Compliance problems: does miss bp med here and there, does a lot of walking at work.    Anxiety and depression. Pt has tried wellbutrin and lexapro. States he stopped both because he felt the same when he was on it.  phq9 and gad 7 done.   Only takes xanax at night.   Virtual Visit via Telephone Note  I connected with [name] on 07/16/19 at 10:30 AM EST by telephone and verified that I am speaking with the correct person using two identifiers.  Location: Patient: home Provider: office   I discussed the limitations, risks, security and privacy concerns of performing an evaluation and management service by telephone and the availability of in person appointments. I also discussed with the patient that there may be a patient responsible charge related to this service. The patient expressed understanding and agreed to proceed.   History of Present Illness:    Observations/Objective:   Assessment and Plan:   Follow Up Instructions:    I discussed the assessment and treatment plan with the patient. The patient was provided an opportunity to ask questions and all were answered. The patient agreed with the plan and demonstrated an understanding of the instructions.   The patient was advised to call back or seek an in-person evaluation if the symptoms worsen or if the condition fails to improve as anticipated.  I provided 20 minutes of non-face-to-face time during this encounter.   Blood pressure medicine and blood pressure levels reviewed today with patient. Compliant with blood pressure medicine. States does not miss a dose. No obvious side effects. Blood pressure generally good when checked elsewhere. Watching salt intake.   Patient notes on 10 mg of Lexapro really did not seem to do much so he basically stopped it.  Still trying to get in  some exercise.  Still ongoing stress with recent personal loss   Review of Systems No headache no chest pain no major shortness of breath    Objective:   Physical Exam  Virtual      Assessment & Plan:  Impression hypertension.  Good control discussed to maintain same  2.  Generalized anxiety disorder.  Suboptimal control discussed with an element of depression also.  Will resume Lexapro but at higher dose rationale discussed follow-up as scheduled

## 2019-07-21 ENCOUNTER — Encounter: Payer: Self-pay | Admitting: Family Medicine

## 2019-07-24 ENCOUNTER — Encounter: Payer: Self-pay | Admitting: Family Medicine

## 2019-08-06 ENCOUNTER — Encounter: Payer: Self-pay | Admitting: Family Medicine

## 2019-08-06 ENCOUNTER — Other Ambulatory Visit: Payer: Self-pay

## 2019-08-06 ENCOUNTER — Ambulatory Visit (HOSPITAL_COMMUNITY)
Admission: RE | Admit: 2019-08-06 | Discharge: 2019-08-06 | Disposition: A | Discharge status: Home / Self Care | Payer: 59 | Source: Ambulatory Visit | Attending: Family Medicine | Admitting: Family Medicine

## 2019-08-06 ENCOUNTER — Ambulatory Visit (INDEPENDENT_AMBULATORY_CARE_PROVIDER_SITE_OTHER): Payer: 59 | Admitting: Family Medicine

## 2019-08-06 VITALS — BP 172/110 | Ht 70.0 in | Wt 205.0 lb

## 2019-08-06 DIAGNOSIS — M25562 Pain in left knee: Secondary | ICD-10-CM | POA: Diagnosis present

## 2019-08-06 MED ORDER — ETODOLAC 400 MG PO TABS: ORAL_TABLET | ORAL | 0 refills | Status: DC

## 2019-08-06 NOTE — Progress Notes (Signed)
   Subjective:    Patient ID: Jamie Chavez, male    DOB: 08/16/1970, 49 y.o.   MRN: [id number]  HPIpt states he was sitting on his porch last night and when he got up he twisted his left knee. Felt like somebody put a knife in his knee when it happened. Walking on crutches today. Taking 4 advil every 3 hours for pain. hadden suden sharp pain left knee last night  May have slipped Or twisted   Now pain very bad intense pain, c=aching     Review of Systems .rs No headache, no major weight loss or weight gain, no chest pain no back pain abdominal pain no change in bowel habits complete ROS otherwise negative     Objective:   Physical Exam Alert vitals stable, NAD. Blood pressure good on repeat. HEENT normal. Lungs clear. Heart regular rate and rhythm. Left knee slight effusion.  Some medial joint line tenderness.  No noticeable joint laxity.  No point tenderness.  Patient using crutches       Assessment & Plan:  Impression knee strain.  Anti-inflammatory medicine prescribed symptom care discussed.  X-ray addendum x-ray showed small calcification no acute injuries.  Patient wished to see if he could recuperate on his own in the coming days before orthopedic referral.  Discussed.

## 2019-08-12 ENCOUNTER — Telehealth: Payer: Self-pay | Admitting: Family Medicine

## 2019-08-12 ENCOUNTER — Other Ambulatory Visit: Payer: Self-pay | Admitting: *Deleted

## 2019-08-12 DIAGNOSIS — M25562 Pain in left knee: Secondary | ICD-10-CM

## 2019-08-12 NOTE — Telephone Encounter (Signed)
Called [facility] and had to leave a message for them to call back to schedule the appt. [phone].

## 2019-08-12 NOTE — Telephone Encounter (Signed)
Called and schedule appt with [facility] [address]. Phone number [phone] with Dr. Clide Dales PA amber for this Thursday 08/15/19 at 9am. Pt called and info given and asked pt to pick up xray images from [facility] to take with him.

## 2019-08-12 NOTE — Telephone Encounter (Signed)
Patient was seen on 2/16 with left knee pain and was discussed a specialist but he wanted to wait a week now he wants to go ahead with referral to specialist as soon as possible. Please advise

## 2019-08-12 NOTE — Telephone Encounter (Signed)
[facility] spcialist maybe have nurse do since acute knee inury

## 2019-10-09 ENCOUNTER — Other Ambulatory Visit (HOSPITAL_COMMUNITY): Payer: Self-pay | Admitting: Specialist

## 2019-10-09 ENCOUNTER — Ambulatory Visit (HOSPITAL_COMMUNITY)
Admission: RE | Admit: 2019-10-09 | Discharge: 2019-10-09 | Disposition: A | Discharge status: Home / Self Care | Payer: 59 | Source: Ambulatory Visit | Attending: Cardiology | Admitting: Cardiology

## 2019-10-09 ENCOUNTER — Other Ambulatory Visit: Payer: Self-pay

## 2019-10-09 DIAGNOSIS — M7989 Other specified soft tissue disorders: Secondary | ICD-10-CM

## 2019-10-09 DIAGNOSIS — M79669 Pain in unspecified lower leg: Secondary | ICD-10-CM | POA: Diagnosis not present

## 2019-10-09 DIAGNOSIS — M79662 Pain in left lower leg: Secondary | ICD-10-CM | POA: Diagnosis not present

## 2019-11-06 ENCOUNTER — Other Ambulatory Visit: Payer: Self-pay | Admitting: Family Medicine

## 2019-12-14 ENCOUNTER — Other Ambulatory Visit: Payer: Self-pay | Admitting: Family Medicine

## 2019-12-16 NOTE — Telephone Encounter (Signed)
Please contact pt to schedule appt, then may route back to nurses. Thank you 

## 2019-12-16 NOTE — Telephone Encounter (Signed)
LVM to schedule appt

## 2019-12-17 NOTE — Telephone Encounter (Signed)
Pt didn't mean to request refill. Didn't realize it was a 90 day supply. He will call back and schedule appt when closer to needing refill.

## 2020-01-20 ENCOUNTER — Telehealth: Payer: Self-pay | Admitting: Family Medicine

## 2020-01-20 NOTE — Telephone Encounter (Signed)
[facility] requesting refill on Xanax 0.5 mg. Take 1/2-1 tablet po BID prn. Pt last seen 08/06/19 for left knee pain. No upcoming appt at this time. Please advise. Thank you

## 2020-01-20 NOTE — Telephone Encounter (Signed)
Please schedule follow up with dr Ladona Ridgel and route back to nurses

## 2020-01-20 NOTE — Telephone Encounter (Signed)
1 refill please pend please follow-up this fall with Dr. Ladona Ridgel

## 2020-01-21 NOTE — Telephone Encounter (Signed)
Left message to schedule appointment

## 2020-01-29 ENCOUNTER — Other Ambulatory Visit: Payer: Self-pay | Admitting: *Deleted

## 2020-01-29 NOTE — Telephone Encounter (Signed)
Scheduled 9/20

## 2020-01-30 MED ORDER — ALPRAZOLAM 0.5 MG PO TABS: ORAL_TABLET | ORAL | 0 refills | Status: DC

## 2020-02-29 ENCOUNTER — Other Ambulatory Visit: Payer: Self-pay | Admitting: Family Medicine

## 2020-03-02 ENCOUNTER — Other Ambulatory Visit: Payer: Self-pay | Admitting: *Deleted

## 2020-03-02 ENCOUNTER — Telehealth: Payer: Self-pay | Admitting: Family Medicine

## 2020-03-02 MED ORDER — HYDROCHLOROTHIAZIDE 25 MG PO TABS: ORAL_TABLET | ORAL | 0 refills | Status: DC

## 2020-03-02 NOTE — Telephone Encounter (Signed)
done

## 2020-03-02 NOTE — Telephone Encounter (Signed)
[facility] requesting refill on HCTZ 25 mg tablet. Take one tablet po in the morning. Pt has appt scheduled on 03/09/20. Please advise. Thank you

## 2020-03-09 ENCOUNTER — Ambulatory Visit: Payer: 59 | Admitting: Family Medicine

## 2020-04-02 ENCOUNTER — Telehealth: Payer: Self-pay

## 2020-04-02 DIAGNOSIS — Z79899 Other long term (current) drug therapy: Secondary | ICD-10-CM

## 2020-04-02 DIAGNOSIS — Z13220 Encounter for screening for lipoid disorders: Secondary | ICD-10-CM

## 2020-04-02 DIAGNOSIS — I10 Essential (primary) hypertension: Secondary | ICD-10-CM

## 2020-04-02 NOTE — Telephone Encounter (Signed)
Patient called because he is running out of medication and needs refills but needs to establish care with Dr. Ladona Ridgel. Patient said he has started a job on first shift and does not get off work until 3:00 in [place] and ABSOLUTELY, ABSOLUTELY, can not be here before 4:00 or 4:15. I told patient that we don't schedule that late and he said that he can't come sooner and will need refills.  Not sure what to do with this patient???????

## 2020-04-03 NOTE — Telephone Encounter (Signed)
Left message to return call 

## 2020-04-03 NOTE — Telephone Encounter (Signed)
Or if he's needing bp meds, just let me know, not sure what he's needing refilled.   D.r Suellyn Meenan

## 2020-04-03 NOTE — Telephone Encounter (Signed)
Pt needs appt for video or phone visit for anxiety medication.  Will not be refilling xanax early,  but pt can have the lexapro refilled till the appt.   Thx.   Dr. Ladona Ridgel

## 2020-04-07 ENCOUNTER — Other Ambulatory Visit: Payer: Self-pay | Admitting: Family Medicine

## 2020-04-07 NOTE — Telephone Encounter (Signed)
Pt returned call. Pt is needing refill on Valsartan and HCTZ and would like 90 day supply sent to [facility]. Pt works an hour away and is available any day after 4pm, prefers 4:15 pm. Pt can do phone visit. Pt no longer taking Lexapro. Please advise. Thank you

## 2020-04-07 NOTE — Telephone Encounter (Signed)
Pt needs to establish care, not seen by me.  Needs recheck of bp in office, last bp was 172 systolic in this office.   Thx.   Dr. Ladona Ridgel

## 2020-04-08 ENCOUNTER — Telehealth: Payer: Self-pay

## 2020-04-08 MED ORDER — VALSARTAN 160 MG PO TABS
160.0000 mg | ORAL_TABLET | Freq: Every day | ORAL | 0 refills | Status: DC
Start: 2020-04-08 — End: 2020-04-15

## 2020-04-08 NOTE — Telephone Encounter (Signed)
Lab orders placed and pt is aware. Pt verbalized understanding. Pt set up appt for 04/15/20 at 4:10 pm

## 2020-04-08 NOTE — Telephone Encounter (Signed)
Can patient make in office appt after 4 pm? Please advise. Thank you

## 2020-04-08 NOTE — Telephone Encounter (Signed)
Pt needs labs, cbc, cmp, lipids, fasting prior to the appt.  Will give small course of medications till can be seen in the next week.  Send me back the date of appt.  4:00 appt. And in person.    Thx.   Dr. Ladona Ridgel

## 2020-04-08 NOTE — Telephone Encounter (Signed)
patient has appointment has appointment on 10/27 and needing labs done

## 2020-04-08 NOTE — Telephone Encounter (Signed)
Orders put in earlier today and pt was notified.

## 2020-04-15 ENCOUNTER — Encounter: Payer: Self-pay | Admitting: Family Medicine

## 2020-04-15 ENCOUNTER — Ambulatory Visit (INDEPENDENT_AMBULATORY_CARE_PROVIDER_SITE_OTHER): Payer: 59 | Admitting: Family Medicine

## 2020-04-15 ENCOUNTER — Other Ambulatory Visit: Payer: Self-pay

## 2020-04-15 VITALS — BP 140/88 | HR 79 | Temp 98.1°F | Ht 70.0 in | Wt 197.4 lb

## 2020-04-15 DIAGNOSIS — M25562 Pain in left knee: Secondary | ICD-10-CM

## 2020-04-15 DIAGNOSIS — D696 Thrombocytopenia, unspecified: Secondary | ICD-10-CM

## 2020-04-15 DIAGNOSIS — I10 Essential (primary) hypertension: Secondary | ICD-10-CM | POA: Diagnosis not present

## 2020-04-15 DIAGNOSIS — G8929 Other chronic pain: Secondary | ICD-10-CM

## 2020-04-15 DIAGNOSIS — F411 Generalized anxiety disorder: Secondary | ICD-10-CM | POA: Diagnosis not present

## 2020-04-15 DIAGNOSIS — Z789 Other specified health status: Secondary | ICD-10-CM | POA: Diagnosis not present

## 2020-04-15 DIAGNOSIS — F1090 Alcohol use, unspecified, uncomplicated: Secondary | ICD-10-CM | POA: Insufficient documentation

## 2020-04-15 LAB — COMPREHENSIVE METABOLIC PANEL
ALT: 66 IU/L — ABNORMAL HIGH (ref 0–44)
AST: 70 IU/L — ABNORMAL HIGH (ref 0–40)
Albumin/Globulin Ratio: 1.7 (ref 1.2–2.2)
Albumin: 4.6 g/dL (ref 4.0–5.0)
Alkaline Phosphatase: 72 IU/L (ref 44–121)
BUN/Creatinine Ratio: 12 (ref 9–20)
BUN: 9 mg/dL (ref 6–24)
Bilirubin Total: 1.1 mg/dL (ref 0.0–1.2)
CO2: 26 mmol/L (ref 20–29)
Calcium: 9.6 mg/dL (ref 8.7–10.2)
Chloride: 102 mmol/L (ref 96–106)
Creatinine, Ser: 0.77 mg/dL (ref 0.76–1.27)
GFR calc Af Amer: 124 mL/min/{1.73_m2} (ref >59)
GFR calc non Af Amer: 107 mL/min/{1.73_m2} (ref >59)
Globulin, Total: 2.7 g/dL (ref 1.5–4.5)
Glucose: 99 mg/dL (ref 65–99)
Potassium: 4.3 mmol/L (ref 3.5–5.2)
Sodium: 142 mmol/L (ref 134–144)
Total Protein: 7.3 g/dL (ref 6.0–8.5)

## 2020-04-15 LAB — CBC WITH DIFFERENTIAL/PLATELET
Basophils Absolute: 0.1 10*3/uL (ref 0.0–0.2)
Basos: 1 %
EOS (ABSOLUTE): 0.2 10*3/uL (ref 0.0–0.4)
Eos: 4 %
Hematocrit: 50.8 % (ref 37.5–51.0)
Hemoglobin: 17.5 g/dL (ref 13.0–17.7)
Immature Grans (Abs): 0 10*3/uL (ref 0.0–0.1)
Immature Granulocytes: 0 %
Lymphocytes Absolute: 2 10*3/uL (ref 0.7–3.1)
Lymphs: 44 %
MCH: 34.8 pg — ABNORMAL HIGH (ref 26.6–33.0)
MCHC: 34.4 g/dL (ref 31.5–35.7)
MCV: 101 fL — ABNORMAL HIGH (ref 79–97)
Monocytes Absolute: 0.4 10*3/uL (ref 0.1–0.9)
Monocytes: 8 %
Neutrophils Absolute: 2 10*3/uL (ref 1.4–7.0)
Neutrophils: 43 %
Platelets: 93 10*3/uL — CL (ref 150–450)
RBC: 5.03 x10E6/uL (ref 4.14–5.80)
RDW: 12.4 % (ref 11.6–15.4)
WBC: 4.7 10*3/uL (ref 3.4–10.8)

## 2020-04-15 LAB — LIPID PANEL
Chol/HDL Ratio: 5.5 ratio — ABNORMAL HIGH (ref 0.0–5.0)
Cholesterol, Total: 203 mg/dL — ABNORMAL HIGH (ref 100–199)
HDL: 37 mg/dL — ABNORMAL LOW (ref >39)
LDL Chol Calc (NIH): 143 mg/dL — ABNORMAL HIGH (ref 0–99)
Triglycerides: 125 mg/dL (ref 0–149)
VLDL Cholesterol Cal: 23 mg/dL (ref 5–40)

## 2020-04-15 MED ORDER — ESCITALOPRAM OXALATE 20 MG PO TABS: ORAL_TABLET | ORAL | 0 refills | Status: DC

## 2020-04-15 MED ORDER — VALSARTAN 40 MG PO TABS: 40.0000 mg | ORAL_TABLET | Freq: Every day | ORAL | 5 refills | Status: DC

## 2020-04-15 MED ORDER — VALSARTAN 160 MG PO TABS: 160.0000 mg | ORAL_TABLET | Freq: Every day | ORAL | 5 refills | Status: DC

## 2020-04-15 NOTE — Progress Notes (Signed)
Patient ID: Jamie Chavez, male    DOB: Oct 13, 1970, 49 y.o.   MRN: [id number]   Chief Complaint  Patient presents with  . Hypertension   Subjective:    HPI  F/u HTN- Pt here for refill on blood pressure medications. Pt has skipped some of med doses. Pt has recently changed shifts at work. Needs refills on HCTZ (90 day supply)   Has h/o chronic knee pain on left- Taking tylenol and aleve for the knee pain.  In 10-19-19 left knee scope 4/21.  bp- Checked it a few days ago 130/85-90.  Pt ran out of the valsartan for a few days.  Kept taking the hctz. Pt stating today he had both his bp meds. bp in room today is very elevated.  Mom died 10/18/17, and father passed in Oct 19, 2019. Pt stating has a lot of anxiety and depression and increased his alcohol intake in past 6 months. Pt was given lexapro in past by last pcp. Pt was supposed to increase from 10mg  lexapro to 20mg  and pt didn't ever take the medication. Pt also was given xanax, and pt continued with this medication.  Pt admits to drinking "a lot of beer." 3-4 beers after work, then weekend 6-7 beers.   Taking omega red tablet for cholesterol.  Medical History Raiyan has no past medical history on file.   Outpatient Encounter Medications as of 04/15/2020  Medication Sig  . ALPRAZolam (XANAX) 0.5 MG tablet TAKE 1/2 TO 1 (ONE-HALF TO ONE) TABLET BY MOUTH TWICE DAILY AS NEEDED  . hydrochlorothiazide (HYDRODIURIL) 25 MG tablet TAKE 1 TABLET BY MOUTH ONCE DAILY IN THE MORNING  . valsartan (DIOVAN) 160 MG tablet Take 1 tablet (160 mg total) by mouth daily. With 40mg  tablet, to equal 200mg  daily.  . [DISCONTINUED] valsartan (DIOVAN) 160 MG tablet Take 1 tablet (160 mg total) by mouth daily.  Jonny Ruiz escitalopram (LEXAPRO) 20 MG tablet Take 1/2 tab p.o. for 7 days and then increase to 1 tab daily.  . valsartan (DIOVAN) 40 MG tablet Take 1 tablet (40 mg total) by mouth daily. Take 1 tablet 40mg  (with 160mg  tablet), p.o. daily.  . [DISCONTINUED]  escitalopram (LEXAPRO) 20 MG tablet Take one tablet po daily  . [DISCONTINUED] etodolac (LODINE) 400 MG tablet Take one po bid with food   No facility-administered encounter medications on file as of 04/15/2020.     Review of Systems  Constitutional: Negative for chills and fever.  HENT: Negative for congestion, rhinorrhea and sore throat.   Respiratory: Negative for cough, shortness of breath and wheezing.   Cardiovascular: Negative for chest pain and leg swelling.  Gastrointestinal: Negative for abdominal pain, diarrhea, nausea and vomiting.  Genitourinary: Negative for dysuria and frequency.  Musculoskeletal:       +left knee pain, chronic  Skin: Negative for rash.  Neurological: Negative for dizziness, weakness and headaches.  Psychiatric/Behavioral: Positive for sleep disturbance. Negative for agitation, behavioral problems, confusion, decreased concentration, dysphoric mood, self-injury and suicidal ideas. The patient is nervous/anxious.      Vitals BP 140/88   Pulse 79   Temp 98.1 F (36.7 C)   Ht 5\' 10"  (1.778 m)   Wt 197 lb 6.4 oz (89.5 kg)   SpO2 100%   BMI 28.32 kg/m   Objective:   Physical Exam Vitals and nursing note reviewed.  Constitutional:      General: He is not in acute distress.    Appearance: Normal appearance. He is not ill-appearing.  HENT:  Head: Normocephalic.     Nose: Nose normal. No congestion.     Mouth/Throat:     Mouth: Mucous membranes are moist.     Pharynx: No oropharyngeal exudate.  Eyes:     Extraocular Movements: Extraocular movements intact.     Conjunctiva/sclera: Conjunctivae normal.     Pupils: Pupils are equal, round, and reactive to light.  Cardiovascular:     Rate and Rhythm: Regular rhythm. Tachycardia present.     Pulses: Normal pulses.     Heart sounds: Normal heart sounds. No murmur heard.   Pulmonary:     Effort: Pulmonary effort is normal. No respiratory distress.     Breath sounds: Normal breath sounds. No  wheezing, rhonchi or rales.  Musculoskeletal:        General: Normal range of motion.     Right lower leg: No edema.     Left lower leg: No edema.  Skin:    General: Skin is warm and dry.     Findings: No rash.  Neurological:     General: No focal deficit present.     Mental Status: He is alert and oriented to person, place, and time.     Cranial Nerves: No cranial nerve deficit.  Psychiatric:        Behavior: Behavior normal.        Thought Content: Thought content normal.        Judgment: Judgment normal.     Comments: +anxious affect      Assessment and Plan   1. Thrombocytopenia (HCC)  2. Essential hypertension, benign - valsartan (DIOVAN) 40 MG tablet; Take 1 tablet (40 mg total) by mouth daily. Take 1 tablet 40mg  (with 160mg  tablet), p.o. daily.  Dispense: 30 tablet; Refill: 5 - valsartan (DIOVAN) 160 MG tablet; Take 1 tablet (160 mg total) by mouth daily. With 40mg  tablet, to equal 200mg  daily.  Dispense: 30 tablet; Refill: 5  3. Generalized anxiety disorder - escitalopram (LEXAPRO) 20 MG tablet; Take 1/2 tab p.o. for 7 days and then increase to 1 tab daily.  Dispense: 30 tablet; Refill: 0  4. Heavy alcohol use  Anxiety/depression- Discussed with pt not able to continue to prescribe the xanax since pt having heavy alcohol use/alcohol use disorder.  Pt needing to work toward decreasing his alcohol intake and try taking lexapro in the meantime. Use melatonin otc for his sleep. Reviewed that continuing to drink large amt of alcohol is worsening depression, anxiety and causing worsening insomnia.  Elevated LFTs and low plts- discussed the concern that his alcohol intake is likely causing some abnormal lab values and affecting his liver.  Advising to cut back on alcohol and discontinue, but not stop abruptly.   htn- suboptimal- on reck his bp is still elevated 150 systolic. Pt given 40mg  and 160mg  valsartan, to equal 200mg  valsartan and cont with 25mg  hctz.  Pt to check bp  daily if seeing numbers over 150/90 call the office. Pt in agreement.  Chronic left knee pain-- aleve, tylenol and ice prn. F/u ortho if cont to have pain.   F/u 4 wk recheck of anxiety and htn.  Recheck labs in 3 months.

## 2020-04-15 NOTE — Patient Instructions (Addendum)
Recommending decrease alcohol intake and smoking to help with your blood pressures.   Will recheck bp on your next visit.   Take lexapro as directed.

## 2020-04-16 ENCOUNTER — Telehealth: Payer: Self-pay | Admitting: Family Medicine

## 2020-04-16 NOTE — Telephone Encounter (Signed)
Left message to return call 

## 2020-04-22 MED ORDER — HYDROCHLOROTHIAZIDE 25 MG PO TABS
25.0000 mg | ORAL_TABLET | Freq: Every day | ORAL | 1 refills | Status: DC
Start: 2020-04-22 — End: 2020-12-09

## 2020-04-22 NOTE — Telephone Encounter (Signed)
Left message to return call 

## 2020-04-22 NOTE — Telephone Encounter (Signed)
Pt contacted and verbalized understanding.   Pt is needing refill of HCTZ sent to [facility]. Please advise. Thank you

## 2020-05-13 ENCOUNTER — Other Ambulatory Visit: Payer: Self-pay

## 2020-05-13 ENCOUNTER — Telehealth: Payer: 59 | Admitting: Family Medicine

## 2020-11-26 ENCOUNTER — Other Ambulatory Visit: Payer: Self-pay | Admitting: Family Medicine

## 2020-11-26 DIAGNOSIS — I10 Essential (primary) hypertension: Secondary | ICD-10-CM

## 2020-11-27 NOTE — Telephone Encounter (Signed)
LVM to Schedule Med Check Appt

## 2020-11-27 NOTE — Telephone Encounter (Signed)
Please schedule appt and then route back

## 2020-12-04 NOTE — Telephone Encounter (Signed)
Sent mychart message

## 2020-12-09 NOTE — Telephone Encounter (Signed)
Patient is needing refill blood pressure medication has been out for a week now has appointment 7/28 with medication follow up

## 2020-12-14 ENCOUNTER — Telehealth: Payer: Self-pay | Admitting: Family Medicine

## 2020-12-14 DIAGNOSIS — I10 Essential (primary) hypertension: Secondary | ICD-10-CM

## 2020-12-14 MED ORDER — VALSARTAN 160 MG PO TABS: 160.0000 mg | ORAL_TABLET | Freq: Every day | ORAL | 0 refills | Status: DC

## 2020-12-14 NOTE — Telephone Encounter (Signed)
Please advise. Thank you

## 2020-12-14 NOTE — Telephone Encounter (Signed)
Left message to return call 

## 2020-12-14 NOTE — Telephone Encounter (Signed)
Patient left voicemail message to follow up on refill request for bp meds. Patient on 2 meds; refill for valsartan (DIOVAN) 160 MG tablet [[id number]]  not called in.   Pharmacy confirmed as   [facility] [address] [place] - [address]  [address] [place], [place] [address]  Phone:  [phone]  Fax:  [phone]   Please advise patient at [phone].

## 2020-12-18 NOTE — Telephone Encounter (Signed)
Pt has appt with Eber Jones on 01/14/21

## 2021-01-14 ENCOUNTER — Other Ambulatory Visit: Payer: Self-pay

## 2021-01-14 ENCOUNTER — Ambulatory Visit (INDEPENDENT_AMBULATORY_CARE_PROVIDER_SITE_OTHER): Payer: 59 | Admitting: Nurse Practitioner

## 2021-01-14 VITALS — BP 132/87 | Wt 200.6 lb

## 2021-01-14 DIAGNOSIS — I10 Essential (primary) hypertension: Secondary | ICD-10-CM | POA: Diagnosis not present

## 2021-01-14 DIAGNOSIS — R7301 Impaired fasting glucose: Secondary | ICD-10-CM

## 2021-01-14 DIAGNOSIS — F411 Generalized anxiety disorder: Secondary | ICD-10-CM

## 2021-01-14 DIAGNOSIS — K760 Fatty (change of) liver, not elsewhere classified: Secondary | ICD-10-CM | POA: Diagnosis not present

## 2021-01-14 DIAGNOSIS — E785 Hyperlipidemia, unspecified: Secondary | ICD-10-CM

## 2021-01-14 DIAGNOSIS — R7989 Other specified abnormal findings of blood chemistry: Secondary | ICD-10-CM

## 2021-01-14 MED ORDER — VALSARTAN 40 MG PO TABS: ORAL_TABLET | ORAL | 1 refills | Status: DC

## 2021-01-14 MED ORDER — VALSARTAN 160 MG PO TABS: 160.0000 mg | ORAL_TABLET | Freq: Every day | ORAL | 1 refills | Status: DC

## 2021-01-14 MED ORDER — SERTRALINE HCL 50 MG PO TABS: 50.0000 mg | ORAL_TABLET | Freq: Every day | ORAL | 0 refills | Status: DC

## 2021-01-14 MED ORDER — HYDROCHLOROTHIAZIDE 25 MG PO TABS: 25.0000 mg | ORAL_TABLET | Freq: Every day | ORAL | 1 refills | Status: DC

## 2021-01-14 NOTE — Progress Notes (Signed)
Subjective:    Patient ID: Jamie Chavez, male    DOB: 1970-08-24, 50 y.o.   MRN: [id number]  HPI Patient arrives for a follow up on blood pressure.  BP on losartan 200 mg total dose has greatly improved.  Has pictures of his monitor on his phone, to most recent BP 132/87 and 109/75.  Denies any chest pain/ischemic type pain or unusual shortness of breath.  No edema.  Patient has stopped escitalopram 20 mg, did not feel it was helping his anxiety.  Still struggling with major losses in his family including both parents and his sister over the past few years.  Denies suicidal or homicidal thoughts or ideation.  Has been having some GI symptoms including occasional burping with some regurgitation.  No burning or pain in the chest or abdominal area.  Drinks 1-2 [employer] per day.  No NSAID use.  Eats a lot of spicy and citrusy foods.  Has noticed symptoms for the past month.  Takes daily omeprazole which has completely controlled his symptoms.  No diarrhea constipation or change in the color of his stools. At 1 point was drinking very heavily, up to a sixpack per day.  Patient has cut back to 3-4 beers per day at most.  Sometimes less. Has cut back his smoking tobacco from 1-1/2 packs/day to 1 pack/day. FMH: His sister had hemochromatosis with elevated liver enzymes.    Objective:   Physical Exam NAD.  Alert, oriented.  Mildly anxious affect.  Cheerful and smiling.  Making good eye contact.  Dressed appropriately.  Thoughts logical coherent and relevant.  Lungs clear.  Heart regular rate rhythm.  Abdomen mildly obese soft nondistended with active bowel sounds x4; no abdominal tenderness noted on exam.  No obvious masses. Today's Vitals   01/14/21 1520  BP: 132/87  Weight: 200 lb 9.6 oz (91 kg)   Body mass index is 28.78 kg/m.       Assessment & Plan:   Problem List Items Addressed This Visit       Cardiovascular and Mediastinum   Essential hypertension, benign - Primary   Relevant  Medications   valsartan (DIOVAN) 160 MG tablet   valsartan (DIOVAN) 40 MG tablet   hydrochlorothiazide (HYDRODIURIL) 25 MG tablet   Other Relevant Orders   CBC with Differential/Platelet   Comprehensive metabolic panel     Digestive   Fatty liver disease, nonalcoholic   Relevant Orders   CBC with Differential/Platelet   Comprehensive metabolic panel     Endocrine   Impaired fasting glucose   Relevant Orders   CBC with Differential/Platelet   Comprehensive metabolic panel     Other   Elevated ferritin level   Relevant Orders   CBC with Differential/Platelet   Ferritin   Generalized anxiety disorder   Relevant Medications   sertraline (ZOLOFT) 50 MG tablet   Hyperlipemia   Relevant Medications   valsartan (DIOVAN) 160 MG tablet   valsartan (DIOVAN) 40 MG tablet   hydrochlorothiazide (HYDRODIURIL) 25 MG tablet   Other Relevant Orders   Lipid panel      Encourage patient to continue to cut back on tobacco and alcohol use. Routine labs ordered. Meds ordered this encounter  Medications   valsartan (DIOVAN) 160 MG tablet    Sig: Take 1 tablet (160 mg total) by mouth daily. With 40mg  tablet, to equal 200mg  daily.    Dispense:  90 tablet    Refill:  1    Order Specific Question:  Supervising Provider    Answer:   Lilyan Punt A [[id number]]   valsartan (DIOVAN) 40 MG tablet    Sig: TAKE 1 TABLET BY MOUTH ONCE DAILY ALONG WITH 160MG  TO EQUAL 200MG     Dispense:  90 tablet    Refill:  1    Order Specific Question:   Supervising Provider    Answer:   A [[id number]]   hydrochlorothiazide (HYDRODIURIL) 25 MG tablet    Sig: Take 1 tablet (25 mg total) by mouth daily.    Dispense:  90 tablet    Refill:  1    Order Specific Question:   Supervising Provider    Answer:   A [[id number]]   sertraline (ZOLOFT) 50 MG tablet    Sig: Take 1 tablet (50 mg total) by mouth daily.    Dispense:  30 tablet    Refill:  0    Order Specific Question:   Supervising Provider     Answer:   Lilyan Punt A [[id number]]   Continue current medications for blood pressure and continue monitoring. Discussed options regarding his anxiety and stress.  Trial of sertraline 50 mg daily.  Reviewed potential adverse effects.  Discontinue medication and contact office if any problems.  Otherwise plan recheck in 1 month.

## 2021-01-15 ENCOUNTER — Encounter: Payer: Self-pay | Admitting: Nurse Practitioner

## 2021-02-24 ENCOUNTER — Other Ambulatory Visit: Payer: Self-pay | Admitting: Nurse Practitioner

## 2021-04-07 ENCOUNTER — Other Ambulatory Visit: Payer: Self-pay | Admitting: Family Medicine

## 2021-06-04 ENCOUNTER — Telehealth: Payer: Self-pay | Admitting: Nurse Practitioner

## 2021-06-07 NOTE — Telephone Encounter (Signed)
Sent my chart message to schedule appointment 06/07/21

## 2021-07-02 NOTE — Telephone Encounter (Signed)
Left message to schedule appointment 07/02/21 °

## 2021-07-09 NOTE — Telephone Encounter (Signed)
Third try call patient to schedule follow up 07/09/21

## 2021-08-23 ENCOUNTER — Telehealth: Payer: Self-pay | Admitting: Family Medicine

## 2021-08-23 ENCOUNTER — Other Ambulatory Visit: Payer: Self-pay

## 2021-08-23 ENCOUNTER — Ambulatory Visit (INDEPENDENT_AMBULATORY_CARE_PROVIDER_SITE_OTHER): Payer: 59 | Admitting: Family Medicine

## 2021-08-23 DIAGNOSIS — K219 Gastro-esophageal reflux disease without esophagitis: Secondary | ICD-10-CM

## 2021-08-23 MED ORDER — PANTOPRAZOLE SODIUM 40 MG PO TBEC: 40.0000 mg | DELAYED_RELEASE_TABLET | Freq: Two times a day (BID) | ORAL | 0 refills | Status: DC

## 2021-08-23 NOTE — Telephone Encounter (Signed)
Mr. Jamie Chavez, Jamie Chavez are scheduled for a virtual visit with your provider today.   ? ?Just as we do with appointments in the office, we must obtain your consent to participate.  Your consent will be active for this visit and any virtual visit you may have with one of our providers in the next 365 days.   ? ?If you have a MyChart account, I can also send a copy of this consent to you electronically.  All virtual visits are billed to your insurance company just like a traditional visit in the office.  As this is a virtual visit, video technology does not allow for your provider to perform a traditional examination.  This may limit your provider's ability to fully assess your condition.  If your provider identifies any concerns that need to be evaluated in person or the need to arrange testing such as labs, EKG, etc, we will make arrangements to do so.   ? ?Although advances in technology are sophisticated, we cannot ensure that it will always work on either your end or our end.  If the connection with a video visit is poor, we may have to switch to a telephone visit.  With either a video or telephone visit, we are not always able to ensure that we have a secure connection.   I need to obtain your verbal consent now.   Are you willing to proceed with your visit today?  ? ?Jamie Chavez has provided verbal consent on 08/23/2021 for a virtual visit (video or telephone). ? ? ?Marlowe Shores, LPN ?08/19/9516  8:41 PM ?  ?

## 2021-08-23 NOTE — Progress Notes (Addendum)
Virtual Visit via Telephone Note ? ?I connected with Jamie Chavez on 08/23/21 at  1:50 PM EST by telephone and verified that I am speaking with the correct person using two identifiers. ? ?Location: ? ?Patient: Home. ?Provider: Office. ? ?I discussed the limitations, risks, security and privacy concerns of performing an evaluation and management service by telephone and the availability of in person appointments. I also discussed with the patient that there may be a patient responsible charge related to this service. The patient expressed understanding and agreed to proceed. ? ? ?History of Present Illness: ?51 year old male presents with complaints of acid reflux. ? ?Patient states that he has been taking omeprazole.  He has been doing this for quite some time and it usually works well.  However, over the past week it has not been helping his symptoms.  He reports that he feels like he has to belch but is unable to do so.  He has had some dry heaving.  No vomiting.  He states that he is now taking omeprazole 40 mg daily without significant improvement.  Continues to smoke.  Has cut back on his alcohol intake but continues to drink.  Denies abdominal pain.  Denies fever.  No reports of chest pain.  No other complaints or concerns at this time. ? ? ?Observations/Objective: ?Patient speaking in full sentences.  No evidence of respiratory distress. ? ?I have reviewed the patient's most recent laboratory studies.   ? ?Assessment and Plan: ?GERD -worsening. ?Stop omeprazole.  Start Protonix 40 mg twice daily. ? ?Follow Up Instructions: ? ?  ?I discussed the assessment and treatment plan with the patient. The patient was provided an opportunity to ask questions and all were answered. The patient agreed with the plan and demonstrated an understanding of the instructions. ?  ?The patient was advised to call back or seek an in-person evaluation if the symptoms worsen or if the condition fails to improve as anticipated. ? ?I  provided 10 minutes of non-face-to-face time during this encounter. ? ?Jamie Sams, DO  ? ?

## 2021-08-26 ENCOUNTER — Emergency Department (HOSPITAL_COMMUNITY): Payer: 59

## 2021-08-26 ENCOUNTER — Inpatient Hospital Stay (HOSPITAL_COMMUNITY)
Admission: EM | Admit: 2021-08-26 | Discharge: 2021-08-31 | DRG: 432 | Disposition: A | Discharge status: Home / Self Care | Payer: 59 | Attending: Family Medicine | Admitting: Family Medicine

## 2021-08-26 ENCOUNTER — Ambulatory Visit
Admission: EM | Admit: 2021-08-26 | Discharge: 2021-08-26 | Disposition: A | Discharge status: Home / Self Care | Payer: 59

## 2021-08-26 ENCOUNTER — Encounter (HOSPITAL_COMMUNITY): Payer: Self-pay | Admitting: Emergency Medicine

## 2021-08-26 ENCOUNTER — Other Ambulatory Visit: Payer: Self-pay

## 2021-08-26 ENCOUNTER — Encounter: Payer: Self-pay | Admitting: Emergency Medicine

## 2021-08-26 DIAGNOSIS — R195 Other fecal abnormalities: Secondary | ICD-10-CM

## 2021-08-26 DIAGNOSIS — R443 Hallucinations, unspecified: Secondary | ICD-10-CM | POA: Diagnosis not present

## 2021-08-26 DIAGNOSIS — D689 Coagulation defect, unspecified: Secondary | ICD-10-CM | POA: Diagnosis not present

## 2021-08-26 DIAGNOSIS — K7031 Alcoholic cirrhosis of liver with ascites: Secondary | ICD-10-CM | POA: Diagnosis present

## 2021-08-26 DIAGNOSIS — K7011 Alcoholic hepatitis with ascites: Secondary | ICD-10-CM | POA: Diagnosis present

## 2021-08-26 DIAGNOSIS — D539 Nutritional anemia, unspecified: Secondary | ICD-10-CM | POA: Diagnosis present

## 2021-08-26 DIAGNOSIS — E440 Moderate protein-calorie malnutrition: Secondary | ICD-10-CM | POA: Diagnosis present

## 2021-08-26 DIAGNOSIS — I959 Hypotension, unspecified: Secondary | ICD-10-CM | POA: Diagnosis not present

## 2021-08-26 DIAGNOSIS — R162 Hepatomegaly with splenomegaly, not elsewhere classified: Secondary | ICD-10-CM | POA: Diagnosis present

## 2021-08-26 DIAGNOSIS — D6959 Other secondary thrombocytopenia: Secondary | ICD-10-CM | POA: Diagnosis present

## 2021-08-26 DIAGNOSIS — K625 Hemorrhage of anus and rectum: Secondary | ICD-10-CM | POA: Diagnosis present

## 2021-08-26 DIAGNOSIS — I10 Essential (primary) hypertension: Secondary | ICD-10-CM | POA: Diagnosis present

## 2021-08-26 DIAGNOSIS — Z23 Encounter for immunization: Secondary | ICD-10-CM | POA: Diagnosis not present

## 2021-08-26 DIAGNOSIS — K7030 Alcoholic cirrhosis of liver without ascites: Secondary | ICD-10-CM | POA: Diagnosis not present

## 2021-08-26 DIAGNOSIS — D696 Thrombocytopenia, unspecified: Secondary | ICD-10-CM

## 2021-08-26 DIAGNOSIS — F411 Generalized anxiety disorder: Secondary | ICD-10-CM | POA: Diagnosis present

## 2021-08-26 DIAGNOSIS — Z20822 Contact with and (suspected) exposure to covid-19: Secondary | ICD-10-CM | POA: Diagnosis present

## 2021-08-26 DIAGNOSIS — D61818 Other pancytopenia: Secondary | ICD-10-CM | POA: Diagnosis not present

## 2021-08-26 DIAGNOSIS — G9349 Other encephalopathy: Secondary | ICD-10-CM

## 2021-08-26 DIAGNOSIS — E538 Deficiency of other specified B group vitamins: Secondary | ICD-10-CM | POA: Diagnosis present

## 2021-08-26 DIAGNOSIS — R7989 Other specified abnormal findings of blood chemistry: Secondary | ICD-10-CM | POA: Diagnosis not present

## 2021-08-26 DIAGNOSIS — K7682 Hepatic encephalopathy: Secondary | ICD-10-CM | POA: Diagnosis present

## 2021-08-26 DIAGNOSIS — F32A Depression, unspecified: Secondary | ICD-10-CM | POA: Diagnosis present

## 2021-08-26 DIAGNOSIS — N179 Acute kidney failure, unspecified: Secondary | ICD-10-CM

## 2021-08-26 DIAGNOSIS — K7010 Alcoholic hepatitis without ascites: Secondary | ICD-10-CM | POA: Diagnosis not present

## 2021-08-26 DIAGNOSIS — K7040 Alcoholic hepatic failure without coma: Principal | ICD-10-CM | POA: Diagnosis present

## 2021-08-26 DIAGNOSIS — D731 Hypersplenism: Secondary | ICD-10-CM | POA: Diagnosis present

## 2021-08-26 DIAGNOSIS — Y906 Blood alcohol level of 120-199 mg/100 ml: Secondary | ICD-10-CM | POA: Diagnosis present

## 2021-08-26 DIAGNOSIS — G9341 Metabolic encephalopathy: Secondary | ICD-10-CM | POA: Diagnosis not present

## 2021-08-26 DIAGNOSIS — F10231 Alcohol dependence with withdrawal delirium: Secondary | ICD-10-CM | POA: Diagnosis present

## 2021-08-26 DIAGNOSIS — E871 Hypo-osmolality and hyponatremia: Secondary | ICD-10-CM | POA: Diagnosis present

## 2021-08-26 DIAGNOSIS — R42 Dizziness and giddiness: Secondary | ICD-10-CM

## 2021-08-26 DIAGNOSIS — E860 Dehydration: Secondary | ICD-10-CM | POA: Diagnosis present

## 2021-08-26 DIAGNOSIS — R519 Headache, unspecified: Secondary | ICD-10-CM | POA: Diagnosis not present

## 2021-08-26 DIAGNOSIS — F17210 Nicotine dependence, cigarettes, uncomplicated: Secondary | ICD-10-CM | POA: Diagnosis present

## 2021-08-26 DIAGNOSIS — J449 Chronic obstructive pulmonary disease, unspecified: Secondary | ICD-10-CM | POA: Diagnosis present

## 2021-08-26 DIAGNOSIS — R0989 Other specified symptoms and signs involving the circulatory and respiratory systems: Secondary | ICD-10-CM | POA: Diagnosis present

## 2021-08-26 DIAGNOSIS — K760 Fatty (change of) liver, not elsewhere classified: Secondary | ICD-10-CM | POA: Diagnosis present

## 2021-08-26 DIAGNOSIS — K219 Gastro-esophageal reflux disease without esophagitis: Secondary | ICD-10-CM | POA: Diagnosis present

## 2021-08-26 DIAGNOSIS — Z79899 Other long term (current) drug therapy: Secondary | ICD-10-CM

## 2021-08-26 DIAGNOSIS — E785 Hyperlipidemia, unspecified: Secondary | ICD-10-CM | POA: Diagnosis present

## 2021-08-26 DIAGNOSIS — Z6829 Body mass index (BMI) 29.0-29.9, adult: Secondary | ICD-10-CM

## 2021-08-26 HISTORY — DX: Alcohol abuse, uncomplicated: F10.10

## 2021-08-26 HISTORY — DX: Essential (primary) hypertension: I10

## 2021-08-26 HISTORY — DX: Gastro-esophageal reflux disease without esophagitis: K21.9

## 2021-08-26 LAB — CBC
HCT: 31.9 % — ABNORMAL LOW (ref 39.0–52.0)
Hemoglobin: 11.6 g/dL — ABNORMAL LOW (ref 13.0–17.0)
MCH: 37.8 pg — ABNORMAL HIGH (ref 26.0–34.0)
MCHC: 36.4 g/dL — ABNORMAL HIGH (ref 30.0–36.0)
MCV: 103.9 fL — ABNORMAL HIGH (ref 80.0–100.0)
Platelets: 30 10*3/uL — ABNORMAL LOW (ref 150–400)
RBC: 3.07 MIL/uL — ABNORMAL LOW (ref 4.22–5.81)
RDW: 14.6 % (ref 11.5–15.5)
WBC: 6 10*3/uL (ref 4.0–10.5)
nRBC: 0 % (ref 0.0–0.2)

## 2021-08-26 LAB — COMPREHENSIVE METABOLIC PANEL
ALT: 67 U/L — ABNORMAL HIGH (ref 0–44)
AST: 174 U/L — ABNORMAL HIGH (ref 15–41)
Albumin: 3.2 g/dL — ABNORMAL LOW (ref 3.5–5.0)
Alkaline Phosphatase: 86 U/L (ref 38–126)
Anion gap: 21 — ABNORMAL HIGH (ref 5–15)
BUN: 20 mg/dL (ref 6–20)
CO2: 15 mmol/L — ABNORMAL LOW (ref 22–32)
Calcium: 8.9 mg/dL (ref 8.9–10.3)
Chloride: 97 mmol/L — ABNORMAL LOW (ref 98–111)
Creatinine, Ser: 1.35 mg/dL — ABNORMAL HIGH (ref 0.61–1.24)
GFR, Estimated: >60 mL/min (ref >60)
Glucose, Bld: 148 mg/dL — ABNORMAL HIGH (ref 70–99)
Potassium: 4.2 mmol/L (ref 3.5–5.1)
Sodium: 133 mmol/L — ABNORMAL LOW (ref 135–145)
Total Bilirubin: 6.7 mg/dL — ABNORMAL HIGH (ref 0.3–1.2)
Total Protein: 7.8 g/dL (ref 6.5–8.1)

## 2021-08-26 LAB — HEPATITIS PANEL, ACUTE
HCV Ab: NONREACTIVE
Hep A IgM: NONREACTIVE
Hep B C IgM: NONREACTIVE
Hepatitis B Surface Ag: NONREACTIVE

## 2021-08-26 LAB — LIPID PANEL
Cholesterol: 151 mg/dL (ref 0–200)
HDL: 13 mg/dL — ABNORMAL LOW (ref >40)
LDL Cholesterol: 108 mg/dL — ABNORMAL HIGH (ref 0–99)
Total CHOL/HDL Ratio: 11.6 RATIO
Triglycerides: 149 mg/dL (ref <150)
VLDL: 30 mg/dL (ref 0–40)

## 2021-08-26 LAB — PROTIME-INR
INR: 1.5 — ABNORMAL HIGH (ref 0.8–1.2)
Prothrombin Time: 17.8 seconds — ABNORMAL HIGH (ref 11.4–15.2)

## 2021-08-26 LAB — RESP PANEL BY RT-PCR (FLU A&B, COVID) ARPGX2
Influenza A by PCR: NEGATIVE
Influenza B by PCR: NEGATIVE
SARS Coronavirus 2 by RT PCR: NEGATIVE

## 2021-08-26 LAB — TYPE AND SCREEN
ABO/RH(D): A POS
Antibody Screen: NEGATIVE

## 2021-08-26 LAB — LIPASE, BLOOD: Lipase: 60 U/L — ABNORMAL HIGH (ref 11–51)

## 2021-08-26 LAB — URINALYSIS, ROUTINE W REFLEX MICROSCOPIC
Glucose, UA: >=500 mg/dL — AB
Hgb urine dipstick: NEGATIVE
Ketones, ur: 5 mg/dL — AB
Leukocytes,Ua: NEGATIVE
Nitrite: NEGATIVE
Protein, ur: 30 mg/dL — AB
Specific Gravity, Urine: 1.02 (ref 1.005–1.030)
pH: 5 (ref 5.0–8.0)

## 2021-08-26 LAB — ETHANOL: Alcohol, Ethyl (B): 133 mg/dL — ABNORMAL HIGH (ref <10)

## 2021-08-26 LAB — ABO/RH: ABO/RH(D): A POS

## 2021-08-26 LAB — HIV ANTIBODY (ROUTINE TESTING W REFLEX): HIV Screen 4th Generation wRfx: NONREACTIVE

## 2021-08-26 LAB — TROPONIN I (HIGH SENSITIVITY)
Troponin I (High Sensitivity): 9 ng/L (ref <18)
Troponin I (High Sensitivity): 10 ng/L (ref <18)

## 2021-08-26 LAB — POC OCCULT BLOOD, ED: Fecal Occult Bld: POSITIVE — AB

## 2021-08-26 LAB — PHOSPHORUS: Phosphorus: 2.2 mg/dL — ABNORMAL LOW (ref 2.5–4.6)

## 2021-08-26 LAB — MAGNESIUM: Magnesium: 1.2 mg/dL — ABNORMAL LOW (ref 1.7–2.4)

## 2021-08-26 LAB — AMMONIA: Ammonia: 62 umol/L — ABNORMAL HIGH (ref 9–35)

## 2021-08-26 LAB — CBG MONITORING, ED: Glucose-Capillary: 146 mg/dL — ABNORMAL HIGH (ref 70–99)

## 2021-08-26 MED ORDER — PANTOPRAZOLE SODIUM 40 MG IV SOLR
40.0000 mg | Freq: Two times a day (BID) | INTRAVENOUS | Status: DC
  Administered 2021-08-26 – 2021-08-28 (×4): 40 mg via INTRAVENOUS
  Filled 2021-08-26 (×4): qty 10

## 2021-08-26 MED ORDER — THIAMINE HCL 100 MG/ML IJ SOLN
100.0000 mg | Freq: Every day | INTRAMUSCULAR | Status: DC
  Administered 2021-08-26: 18:00:00 100 mg via INTRAVENOUS
  Filled 2021-08-26: qty 2

## 2021-08-26 MED ORDER — PANTOPRAZOLE SODIUM 40 MG IV SOLR
40.0000 mg | Freq: Once | INTRAVENOUS | Status: AC
  Administered 2021-08-26: 14:00:00 40 mg via INTRAVENOUS
  Filled 2021-08-26: qty 10

## 2021-08-26 MED ORDER — THIAMINE HCL 100 MG PO TABS
100.0000 mg | ORAL_TABLET | Freq: Every day | ORAL | Status: DC
  Administered 2021-08-27 – 2021-08-31 (×5): 100 mg via ORAL
  Filled 2021-08-26 (×6): qty 1

## 2021-08-26 MED ORDER — LORAZEPAM 1 MG PO TABS
1.0000 mg | ORAL_TABLET | Freq: Once | ORAL | Status: AC
  Administered 2021-08-26: 18:00:00 1 mg via ORAL
  Filled 2021-08-26: qty 1

## 2021-08-26 MED ORDER — ADULT MULTIVITAMIN W/MINERALS CH
1.0000 | ORAL_TABLET | Freq: Every day | ORAL | Status: DC
  Administered 2021-08-26 – 2021-08-31 (×6): 1 via ORAL
  Filled 2021-08-26 (×6): qty 1

## 2021-08-26 MED ORDER — MAGNESIUM SULFATE 4 GM/100ML IV SOLN
4.0000 g | Freq: Once | INTRAVENOUS | Status: AC
  Administered 2021-08-26: 21:00:00 4 g via INTRAVENOUS
  Filled 2021-08-26: qty 100

## 2021-08-26 MED ORDER — LACTULOSE 10 GM/15ML PO SOLN
30.0000 g | Freq: Three times a day (TID) | ORAL | Status: DC
  Administered 2021-08-26 – 2021-08-31 (×14): 30 g via ORAL
  Filled 2021-08-26 (×13): qty 45

## 2021-08-26 MED ORDER — SODIUM CHLORIDE 0.9 % IV SOLN: INTRAVENOUS | Status: DC

## 2021-08-26 MED ORDER — ESCITALOPRAM OXALATE 10 MG PO TABS
10.0000 mg | ORAL_TABLET | Freq: Every day | ORAL | Status: DC | PRN
  Administered 2021-08-27: 10 mg via ORAL
  Filled 2021-08-26: qty 1

## 2021-08-26 MED ORDER — SODIUM CHLORIDE 0.9 % IV BOLUS
1000.0000 mL | Freq: Once | INTRAVENOUS | Status: AC
  Administered 2021-08-26: 11:00:00 1000 mL via INTRAVENOUS

## 2021-08-26 MED ORDER — FOLIC ACID 1 MG PO TABS
1.0000 mg | ORAL_TABLET | Freq: Every day | ORAL | Status: DC
  Administered 2021-08-26 – 2021-08-31 (×6): 1 mg via ORAL
  Filled 2021-08-26 (×6): qty 1

## 2021-08-26 MED ORDER — SODIUM PHOSPHATES 45 MMOLE/15ML IV SOLN
30.0000 mmol | Freq: Once | INTRAVENOUS | Status: AC
  Administered 2021-08-26: 22:00:00 30 mmol via INTRAVENOUS
  Filled 2021-08-26: qty 10

## 2021-08-26 NOTE — Discharge Instructions (Signed)
Please go to emergency department for further care and management. 

## 2021-08-26 NOTE — ED Provider Triage Note (Signed)
Emergency Medicine Provider Triage Evaluation Note ? ?Jamie Chavez , a 51 y.o. male  was evaluated in triage.  Pt complains of lightheadedness. Report hx of reflux and was out of his medication for nearly 2 weeks.  Started on pantoprazole several days prior but no improvement of reflux.  Now feels lightheadedness, dark stool, nausea.  Denies NSAIDs use but drink drink and smoke regularly ? ?Review of Systems  ?Positive: Indigestion, lightheadedness, dark color stool, acid reflux ?Negative: Fever, cp, abd pain, focal numbness ? ?Physical Exam  ?BP 118/69 (BP Location: Right Arm)   Pulse (!) 101   Temp 98.1 ?F (36.7 ?C) (Oral)   Resp 18   SpO2 97%  ?Gen:   Awake, no distress   ?Resp:  Normal effort  ?MSK:   Moves extremities without difficulty  ?Other:   ? ?Medical Decision Making  ?Medically screening exam initiated at 9:55 AM.  Appropriate orders placed.  Jamie Chavez was informed that the remainder of the evaluation will be completed by another provider, this initial triage assessment does not replace that evaluation, and the importance of remaining in the ED until their evaluation is complete. ? ? ?  ?Fayrene Helper, PA-C ?08/26/21 0957 ? ?

## 2021-08-26 NOTE — ED Notes (Signed)
Patient is being discharged from the Urgent Care and sent to the Emergency Department via pov . Per Kennyth Lose wood, pa, patient is in need of higher level of care due to limited resources at [facility] and patient requests. Patient is aware and verbalizes understanding of plan of care.  ?Vitals:  ? 08/26/21 0854  ?BP: 115/68  ?Pulse: 95  ?Resp: 18  ?Temp: 98.3 ?F (36.8 ?C)  ?SpO2: 100%  ?  ?

## 2021-08-26 NOTE — ED Triage Notes (Signed)
Patient here with complaint of burning in his chest that started after running out of omeprazole several days ago. Patient states he was seen at PCP, placed on pantoprazole but symptoms continue. Patient also reports feeling lightheaded and dizzy. Patient denies pain, is alert, oriented, ambulatory, and in no apparent distress at this time. ?

## 2021-08-26 NOTE — Consult Note (Addendum)
UNASSIGNED CONSULT ? ?Reason for Consult:ETOH cirrhosis ?Referring Physician: [facility] ? ?Jamie Chavez ?HPI: This is a 51 year old male with a PMH of ETOH abuse, GERD, and HTN admitted for confusion, headache, and feeling lightheaded.  Work up in the ER shows that he has a cirrhotic liver and his ETOH level was elevated at 133.  His last drink was yesterday.  Over the past several years he lost his father, mother, and sister.  As a result he accelerated his ETOH with drinking liquor.  He states that his sister died from complications of hemochromatosis.  Last week he reported vomiting severely and he thinks he may have vomited some blood.  He was not sure.  There is notable drop in his platelets from 93 to 30 and his liver enzymes were elevated during this admission:  AST 174, ALT 67, albumin 3.2, and TB 6.7.  There is also evidence of renal insufficiency with a creatinine at 1.35.  His baseline appears to be at .7-.8. ? ?Past Medical History:  ?Diagnosis Date  ? GERD (gastroesophageal reflux disease)   ? Hypertension   ? ? ?Past Surgical History:  ?Procedure Laterality Date  ? ARTHROSCOPIC REPAIR ACL    ? SHOULDER ARTHROSCOPY    ? ? ?Family History  ?Problem Relation Age of Onset  ? Liver disease Sister   ? ? ?Social History:  reports that he has been smoking cigarettes. He has been smoking an average of 1 pack per day. He has never used smokeless tobacco. He reports current alcohol use of about 21.0 standard drinks per week. He reports that he does not use drugs. ? ?Allergies: No Known Allergies ? ?Medications: Scheduled: ?Continuous: ? ?Results for orders placed or performed during the hospital encounter of 08/26/21 (from the past 24 hour(s))  ?CBG monitoring, ED     Status: Abnormal  ? Collection Time: 08/26/21 10:01 AM  ?Result Value Ref Range  ? Glucose-Capillary 146 (H) 70 - 99 mg/dL  ?CBC     Status: Abnormal  ? Collection Time: 08/26/21 10:02 AM  ?Result Value Ref Range  ? WBC 6.0 4.0 - 10.5 K/uL   ? RBC 3.07 (L) 4.22 - 5.81 MIL/uL  ? Hemoglobin 11.6 (L) 13.0 - 17.0 g/dL  ? HCT 31.9 (L) 39.0 - 52.0 %  ? MCV 103.9 (H) 80.0 - 100.0 fL  ? MCH 37.8 (H) 26.0 - 34.0 pg  ? MCHC 36.4 (H) 30.0 - 36.0 g/dL  ? RDW 14.6 11.5 - 15.5 %  ? Platelets 30 (L) 150 - 400 K/uL  ? nRBC 0.0 0.0 - 0.2 %  ?Lipase, blood     Status: Abnormal  ? Collection Time: 08/26/21 10:02 AM  ?Result Value Ref Range  ? Lipase 60 (H) 11 - 51 U/L  ?Comprehensive metabolic panel     Status: Abnormal  ? Collection Time: 08/26/21 10:02 AM  ?Result Value Ref Range  ? Sodium 133 (L) 135 - 145 mmol/L  ? Potassium 4.2 3.5 - 5.1 mmol/L  ? Chloride 97 (L) 98 - 111 mmol/L  ? CO2 15 (L) 22 - 32 mmol/L  ? Glucose, Bld 148 (H) 70 - 99 mg/dL  ? BUN 20 6 - 20 mg/dL  ? Creatinine, Ser 1.35 (H) 0.61 - 1.24 mg/dL  ? Calcium 8.9 8.9 - 10.3 mg/dL  ? Total Protein 7.8 6.5 - 8.1 g/dL  ? Albumin 3.2 (L) 3.5 - 5.0 g/dL  ? AST 174 (H) 15 - 41 U/L  ?  ALT 67 (H) 0 - 44 U/L  ? Alkaline Phosphatase 86 38 - 126 U/L  ? Total Bilirubin 6.7 (H) 0.3 - 1.2 mg/dL  ? GFR, Estimated >60 >60 mL/min  ? Anion gap 21 (H) 5 - 15  ?Troponin I (High Sensitivity)     Status: None  ? Collection Time: 08/26/21 10:37 AM  ?Result Value Ref Range  ? Troponin I (High Sensitivity) 9 <18 ng/L  ?Urinalysis, Routine w reflex microscopic     Status: Abnormal  ? Collection Time: 08/26/21 11:23 AM  ?Result Value Ref Range  ? Color, Urine AMBER (A) YELLOW  ? APPearance HAZY (A) CLEAR  ? Specific Gravity, Urine 1.020 1.005 - 1.030  ? pH 5.0 5.0 - 8.0  ? Glucose, UA >=500 (A) NEGATIVE mg/dL  ? Hgb urine dipstick NEGATIVE NEGATIVE  ? Bilirubin Urine SMALL (A) NEGATIVE  ? Ketones, ur 5 (A) NEGATIVE mg/dL  ? Protein, ur 30 (A) NEGATIVE mg/dL  ? Nitrite NEGATIVE NEGATIVE  ? Leukocytes,Ua NEGATIVE NEGATIVE  ? RBC / HPF 0-5 0 - 5 RBC/hpf  ? WBC, UA 6-10 0 - 5 WBC/hpf  ? Bacteria, UA FEW (A) NONE SEEN  ? Squamous Epithelial / LPF 0-5 0 - 5  ? WBC Clumps PRESENT   ? Mucus PRESENT   ? Hyaline Casts, UA PRESENT   ? Non  Squamous Epithelial 0-5 (A) NONE SEEN  ?Ethanol     Status: Abnormal  ? Collection Time: 08/26/21 12:10 PM  ?Result Value Ref Range  ? Alcohol, Ethyl (B) 133 (H) <10 mg/dL  ?Troponin I (High Sensitivity)     Status: None  ? Collection Time: 08/26/21 12:10 PM  ?Result Value Ref Range  ? Troponin I (High Sensitivity) 10 <18 ng/L  ?POC occult blood, ED     Status: Abnormal  ? Collection Time: 08/26/21 12:30 PM  ?Result Value Ref Range  ? Fecal Occult Bld POSITIVE (A) NEGATIVE  ?  ? ?CT Head Wo Contrast ? ?Result Date: 08/26/2021 ?CLINICAL DATA:  Mental status change. EXAM: CT HEAD WITHOUT CONTRAST TECHNIQUE: Contiguous axial images were obtained from the base of the skull through the vertex without intravenous contrast. RADIATION DOSE REDUCTION: This exam was performed according to the departmental dose-optimization program which includes automated exposure control, adjustment of the mA and/or kV according to patient size and/or use of iterative reconstruction technique. COMPARISON:  None. FINDINGS: Brain: The ventricles are normal in size and configuration. No extra-axial fluid collections are identified. The gray-white differentiation is maintained. No CT findings for acute hemispheric infarction or intracranial hemorrhage. No mass lesions. The brainstem and cerebellum are normal. Vascular: No hyperdense vessels or obvious aneurysm. Skull: No acute skull fracture.  No bone lesion. Sinuses/Orbits: The paranasal sinuses and mastoid air cells are clear. The globes are intact. Other: No scalp lesions, laceration or hematoma. IMPRESSION: No acute intracranial findings or mass lesions. Electronically Signed   By: Marijo Sanes M.D.   On: 08/26/2021 11:05  ? ?US Abdomen Limited RUQ (LIVER/GB) ? ?Result Date: 08/26/2021 ?CLINICAL DATA:  Elevated liver function tests EXAM: ULTRASOUND ABDOMEN LIMITED RIGHT UPPER QUADRANT COMPARISON:  03/22/2010 FINDINGS: Gallbladder: No wall thickening or focal tenderness. No detected calculi,  small volume sludge is possible. Common bile duct: Diameter: 5 mm Liver: Echogenic, heterogeneous, and poorly transmitting echoes. Nodular appearance of the surface is well. Portal vein is patent on color Doppler imaging with normal direction of blood flow towards the liver. IMPRESSION: 1. Cirrhotic appearance of the liver. 2. Cholelithiasis.  Electronically Signed   By: Jorje Guild M.D.   On: 08/26/2021 12:01   ? ?ROS:  As stated above in the HPI otherwise negative. ? ?Blood pressure 133/81, pulse (!) 117, temperature 98.1 ?F (36.7 ?C), temperature source Oral, resp. rate 11, height 5\' 10"  (1.778 m), weight 90.7 kg, SpO2 97 %.   ? ?PE: ?Gen: NAD, Alert and Oriented ?HEENT:  [place]/AT, EOMI ?Neck: Supple, no LAD ?Lungs: CTA Bilaterally ?CV: RRR without M/G/R ?ABD: Soft, NTND, +BS ?Ext: No C/C/E, +asterixis ? ?Assessment/Plan: ?1) ETOH hepatitis. ?2) ETOH cirrhosis. ?3) ETOH abuse. ?4) Thrombocytopenia. ?5) Family history of hemochromatosis with his sister. ?6) Heme positive stool. ?7) Hepatic encephalopathy - mild. ? ? The patient has evidence of an ETOH hepatitis.  He was noted to have hepatosplenomegaly in 2011.  At that time he had fatty liver and it was progressive over the years, especially with his increase in drinking liquor.  In the past he reported only drinking beer.  There is the possibility of hemochromatosis and this needs to be evaluated.  Also, he was noted to be heme positive.  There is a decline in his HGB, but his baseline is unknown.  Further evaluation with an EGD/colonoscopy is necessary to screen for varices and to work up the heme positive stool, but his platelets are too low.  This may increase during this hospitalization or with time.  The procedures can be performed on an outpatient basis in the near future also.  There is a family history of hemochromatosis and he needs to be checked for this issue. ? ?Plan: ?1) Supportive care. ?2) Monitor for signs and symptoms of withdrawal. ?3)  Professional counseling for ETOH abuse. ?4) Check for HFE. ?5) Monitor HGB and platelets. ?6) Lactulose. ?  ? ?Alsie Younes D ?08/26/2021, 3:17 PM  ? ? ?  ?

## 2021-08-26 NOTE — Progress Notes (Signed)
FPTS Brief Progress Note ? ?S: Pt feels better after receiving fluids and medications, he is unsure which of those in particular made him feel better. Amenable to blood transfusion if necessary. Does not feel as anxious as previously.  ? ? ?O: ?BP (!) 145/79   Pulse (!) 115   Temp 98.1 ?F (36.7 ?C) (Oral)   Resp 19   Ht 5\' 10"  (1.778 m)   Wt 90.7 kg   SpO2 97%   BMI 28.70 kg/m?   ?Gen: awake, alert, calm, NAD, appropriate in conversation ?Eyes: EOMI, without nystagmus ?CV: RRR, no murmurs auscultated ?Pulm: CTAB, normal WOB ?Abdomen: soft, nontender, obese abdomen ? ?A/P: ?Electrolyte abnormalities  Alcohol abuse ?Magnesium 1.2, Phosphorus 2.2. CIWA improved from 9>5 after 1mg  ativan. Will continue to monitor. ?-Mag 4g IV ?-Na Phos 66mmol in D5 ?-CIWA q4h, ativan per protocol ? ?Thrombocytopenia ?Amenable to blood transfusion. Type and screen performed. Stable without signs of bleeding at this time.  ?-AM CBC, transfuse plts if <10 or if new signs of acute bleed ? ? ?-continue management per day team ?- Orders reviewed. Labs for AM ordered, which was adjusted as needed.  ? ?Wells Guiles, DO ?08/26/2021, 8:59 PM ?PGY-1, [facility] Night Resident  ?Please page [phone] with questions.  ? ? ?

## 2021-08-26 NOTE — H&P (Addendum)
Family Medicine Teaching [facility] Admission History and Physical Service Pager: [phone]  Patient name: Jamie Chavez Medical record number: [id number] Date of birth: May 27, 1971 Age: 51 y.o. Gender: male  Primary Care Provider: [name], DO Consultants: Gastroenterology Code Status: Full which was confirmed with family if patient unable to confirm   Preferred Emergency Contact: Fonda Kinder (daughter)- [phone],   Chief Complaint: dizziness, diminished appetite, GI discomfort  Assessment and Plan: Jamie Chavez is a 51 y.o. male presenting with dizziness, diminished appetite and GI discomfort, found to have alcoholic hepatitis and cirrhosis on laboratory and imaging studies today. PMH is significant for HTN, GERD, alcohol use, tobacco use, hx depression/anxiety.  Abdominal discomfort likely 2/2 alcoholic hepatitis; alcoholic liver cirrhosis with transaminitis Seen at Urgent Care this AM with acid reflux, dizziness, diminished appetite worsening for past 2 weeks, sent to ED for further evaluation.  In ED, hemodynamically stable. RUQ U/S showed cirrhotic appearance of liver with cholelithiasis. CT head negative. Bilirubin elevated to 6.1, AST 174, ALT 67, albumin 3.2, and TB 6.7. Clinical symptoms and imaging/lab studies consistent with alcoholic hepatitis. Also must consider pancreatitis, though not having intense nausea or back pain and lipase only slightly elevated to 60.  Family hx hemochromatosis, apparently sister passed away from complications of this disease. GI consulted and recommended monitoring and supportive care for now with professional ETOH counseling, and monitoring for signs /sx withdrawal. Will rule out other potential causes of cirrhosis including hepatitides. - Admit to FPTS Progressive, admitting Dr. Janit Pagan - GI consulted, appreciate care - Hepatitis panel, Hepatitis C Ab - Avoid hepatotoxic medications - GI recommendation to pursue hemochromatosis (HFE  gene) workup inpatient vs. Outpatient - Iron studies ordered - Regular diet - Vital signs q4h  Dizziness with concern for potential upper GI vs. Lower GI bleed Reports excessive heaving for past several weeks with dark colored stools and dizziness. Hgb 8.5 and FOBT+. Could be secondary to esophageal varices vs. Mallory Weiss tear, possible GI malignancy. GI recommend EGD/colonoscopy to evaluate for varices and heme positive stool, however his platelet count is currently too low to proceed with procedure. Will monitor Hgb/platelets closely. and if appropriate, can pursue procedures while inpatient. - S/p 2 large bore IV's - AM CBC - If any active signs of bleeding (melena, hematochezia) or platelet count < 10k, transfuse platelets - Transfusion threshold Hgb <7.0 for pRBC  AKI Cr elevated to 1.35 on admission. Baseline appears to be 0.7-0.8. Likely secondary to decreased PO intake over past few weeks +/- potential dehydration. - NS mIVF at 125 ml/hr; - Regular diet - AM CMP to monitor Cr  Hypertension Chronic. BP largely within goal <140/90 on admission. Prescribed HCTZ, but does not take it as he ran out. Takes 200mg  Valsartan daily. - Monitor VS - Lipid panel - Hold Valsartan in setting of AKI and potential GI bleed - AM CMP  GERD Worsening for past few weeks. Used to take Omeprazole 40mg  daily without improvement, recently prescribed Protonix 40mg  BID. - IV Protonix 40mg  BID - Hold home PO Protonix   Severe alcohol use, tobacco use Reports last drink last night around 9pm. Drinks four 12 oz beers and ten 2 oz shots per day. Denies history of alcohol withdrawal, seizures or DT. Reports he has never tried to quit drinking in the past, but does say he'd be interested in quitting now. Does not appear to be in withdrawal currently, especially given alcohol level on arrival of 133. - TOC for substance use  resources, smoking cesssation - CIWA protocol -No as needed Ativan at this time  but if sustained elevated CIWA's consider addition of as needed Ativan  Hx depression, anxiety Patient reports multiple family deaths in 2019- lost sister, mother, and father. Struggled with drinking since then. Takes lexapro as needed. Used to take Xanax for anxiety. Denies suicidal ideation but reports feeling very anxious and nervous today, especially in regards to hospitalization. - Educated patient on importance of taking SSRI daily for optimal effects - Resume Lexapro   FEN/GI: Med-Surg Prophylaxis: SCD's bilaterally, no pharmacologic VTE prophylaxis given high-bleed risk  Disposition: Progressive  History of Present Illness:  Jamie Chavez is a 51 y.o. male presenting with 2 weeks of GI discomfort (dry heaving, throwing up white colored foam). Also has had 1 week of dark stools.  Denies hematemesis. Has noted darker colored stools and bright red blood per rectum when wiping on occasion, thought it was due to hemorrhoids.  He recently saw his PCP on Monday 3/6 because his Omeprazole was not helping control his symptoms and he was switched to Protonix. Also reports that everything seems to be a yellow/orange tint with vision.  Smokes 1 PPD. Reports drinking 3-4 beers and 10 shots daily. When asked how long he has been drinking alcohol, he says he has been drinking like that for "too long."  He started drinking heavier in 2019 when he had multiple deaths in his family losing his mother, father and sister. Prescribed Lexapro for anxiety and depression but only takes it intermittently.  Works in a [employer]. Lives alone at home with dogs, cats, and goats. Has a son and daughter.  Review Of Systems: Per HPI with the following additions:   Review of Systems  Constitutional:  Positive for fatigue. Negative for chills and fever.  Eyes:  Positive for visual disturbance (Seeing yellow).  Respiratory:  Negative for chest tightness, shortness of breath and wheezing.   Cardiovascular:   Negative for palpitations and leg swelling.  Gastrointestinal:  Positive for blood in stool, nausea and vomiting. Negative for constipation and diarrhea.  Genitourinary:  Negative for dysuria.  Musculoskeletal:  Negative for arthralgias, myalgias and neck stiffness.  Neurological:  Positive for dizziness and light-headedness. Negative for tremors, weakness and numbness.    Patient Active Problem List   Diagnosis Date Noted   Alcoholic hepatitis 08/26/2021   Heavy alcohol use 04/15/2020   Generalized anxiety disorder 09/25/2017   Elevated ferritin level 05/08/2013   Essential hypertension, benign 03/25/2013   Fatty liver disease, nonalcoholic 03/25/2013   Elevated liver enzymes 03/25/2013   Hyperlipemia 03/25/2013    Past Medical History: Past Medical History:  Diagnosis Date   GERD (gastroesophageal reflux disease)    Hypertension     Past Surgical History: Past Surgical History:  Procedure Laterality Date   ARTHROSCOPIC REPAIR ACL     SHOULDER ARTHROSCOPY      Social History: Social History   Tobacco Use   Smoking status: Every Day    Packs/day: 1.00    Types: Cigarettes   Smokeless tobacco: Never  Vaping Use   Vaping Use: Never used  Substance Use Topics   Alcohol use: Yes    Alcohol/week: 21.0 standard drinks    Types: 21 Cans of beer per week    Comment: Drinks 3-4 beers per day   Drug use: Never    Family History: Family History  Problem Relation Age of Onset   Liver disease Sister     Allergies and  Medications: No Known Allergies No current facility-administered medications on file prior to encounter.   Current Outpatient Medications on File Prior to Encounter  Medication Sig Dispense Refill   escitalopram (LEXAPRO) 10 MG tablet Take 10 mg by mouth daily as needed (anxiety).     loperamide (IMODIUM A-D) 2 MG tablet Take 2 mg by mouth daily.     pantoprazole (PROTONIX) 40 MG tablet Take 1 tablet (40 mg total) by mouth 2 (two) times daily before a  meal. 180 tablet 0   valsartan (DIOVAN) 160 MG tablet Take 1 tablet (160 mg total) by mouth daily. With 40mg  tablet, to equal 200mg  daily. 90 tablet 1   valsartan (DIOVAN) 40 MG tablet TAKE 1 TABLET BY MOUTH ONCE DAILY ALONG WITH 160MG  TO EQUAL 200MG  (Patient taking differently: Take 40 mg by mouth daily. TAKE WITH 160MG  TO EQUAL 200MG ) 90 tablet 1   hydrochlorothiazide (HYDRODIURIL) 25 MG tablet Take 1 tablet (25 mg total) by mouth daily. 90 tablet 1    Objective: BP (!) 162/81    Pulse (!) 118    Temp 98.1 F (36.7 C) (Oral)    Resp 16    Ht 5\' 10"  (1.778 m)    Wt 90.7 kg    SpO2 95%    BMI 28.70 kg/m  Exam: General: Male appears stated age, in no distress, laying in stretcher comfortably Eyes: Conjunctival injection bilaterally. Pupils PERRLA, EOMI. No scleral icterus. No conjunctival pallor. ENTM: MMM. Decent dentition. Tongue fasciculations noted. No pharyngeal erythema, no palatal patechia. No sublingual pallor. Neck: Normal ROM Cardiovascular: RRR, no murmurs appreciated Respiratory: Normal work of breathing on room air. Lungs clear in all fields with no wheezing or crackles Gastrointestinal: Abdomen obese, Soft, non-tender. No appreciable fluid wave or ascites. Small bruise over right mid-abdomen. Hepatomegaly. MSK: No joint deformities Derm: Skin without jaundice, no rashes or lesions Neuro: Awake, alert, oriented x4. Speech clear and fluent. No focal neurologic deficits Psych: Anxious affect. Pleasant and conversational. Answers questions appropriately.  Labs and Imaging: CBC BMET  Recent Labs  Lab 08/26/21 1002  WBC 6.0  HGB 11.6*  HCT 31.9*  PLT 30*   Recent Labs  Lab 08/26/21 1002  NA 133*  K 4.2  CL 97*  CO2 15*  BUN 20  CREATININE 1.35*  GLUCOSE 148*  CALCIUM 8.9     EKG: My own interpretation (not copied from electronic read) NSR, no acute ST or T wave changes  Darral Dashameron, Marisa, DO 08/26/2021, 5:42 PM PGY-1, [facility] FPTS Intern pager:  [phone], text pages welcome   FPTS Upper-Level Resident Addendum   I have independently interviewed and examined the patient. I have discussed the above with the original author and agree with their documentation. Please see also any attending notes.   Derrel NipVictor Aysha Livecchi, MD PGY-3, [facility] 08/26/2021 6:10 PM  FPTS Service pager: [phone] (text pages welcome through [facility])

## 2021-08-26 NOTE — ED Notes (Signed)
Lab reports adding troponin on to previous lab draw.  ?

## 2021-08-26 NOTE — ED Provider Notes (Signed)
[facility] EMERGENCY DEPARTMENT Provider Note   CSN: [id number] Arrival date & time: 08/26/21  0947     History  Chief Complaint  Patient presents with   Dizziness    Jamie Chavez is a 51 y.o. male.  He has a history of hypertension and GERD.  Said for the last 2 weeks he has had increased reflux symptoms.  His doctor switched his PPI.  He has not been eating or drinking much.  For a few days he has noticed some vision changes especially in the morning.  Today at work he was lightheaded and dizzy.  Coworker took him to [employer] [employer] where they recommended he get a head CT but it was not available at that time so he was discharged.  He denies any current chest pain or shortness of breath.  No abdominal pain.  No numbness or weakness. Daily drinker.   The history is provided by the patient.  Dizziness Quality:  Lightheadedness Severity:  Moderate Onset quality:  Gradual Duration:  3 hours Timing:  Intermittent Progression:  Unchanged Chronicity:  New Relieved by:  None tried Worsened by:  Nothing Ineffective treatments:  None tried Associated symptoms: nausea, vision changes and vomiting   Associated symptoms: no chest pain, no headaches, no hearing loss, no shortness of breath, no syncope and no weakness       Home Medications Prior to Admission medications   Medication Sig Start Date End Date Taking? Authorizing Provider  hydrochlorothiazide (HYDRODIURIL) 25 MG tablet Take 1 tablet (25 mg total) by mouth daily. Patient taking differently: Take 25 mg by mouth daily. Out of this medicine for 2 days 01/14/21   Nilda Simmer, NP  pantoprazole (PROTONIX) 40 MG tablet Take 1 tablet (40 mg total) by mouth 2 (two) times daily before a meal. 08/23/21   Cook, Barnie Del, DO  valsartan (DIOVAN) 160 MG tablet Take 1 tablet (160 mg total) by mouth daily. With 40mg  tablet, to equal 200mg  daily. 01/14/21   Nilda Simmer, NP  valsartan (DIOVAN) 40 MG tablet TAKE 1 TABLET BY  MOUTH ONCE DAILY ALONG WITH 160MG  TO EQUAL 200MG  01/14/21   Nilda Simmer, NP      Allergies    Patient has no known allergies.    Review of Systems   Review of Systems  Constitutional:  Negative for fever.  HENT:  Negative for hearing loss and sore throat.   Eyes:  Positive for visual disturbance.  Respiratory:  Positive for cough. Negative for shortness of breath.   Cardiovascular:  Negative for chest pain and syncope.  Gastrointestinal:  Positive for nausea and vomiting. Negative for abdominal pain.  Genitourinary:  Negative for dysuria.  Musculoskeletal:  Negative for gait problem.  Skin:  Negative for rash.  Neurological:  Positive for dizziness. Negative for weakness, numbness and headaches.   Physical Exam Updated Vital Signs BP 118/69 (BP Location: Right Arm)    Pulse (!) 101    Temp 98.1 F (36.7 C) (Oral)    Resp 18    SpO2 97%  Physical Exam Vitals and nursing note reviewed.  Constitutional:      General: He is not in acute distress.    Appearance: Normal appearance. He is well-developed.  HENT:     Head: Normocephalic and atraumatic.  Eyes:     Extraocular Movements: Extraocular movements intact.     Conjunctiva/sclera: Conjunctivae normal.     Pupils: Pupils are equal, round, and reactive to light.  Funduscopic exam:    Right eye: No hemorrhage or exudate.        Left eye: No hemorrhage or exudate.     Slit lamp exam:    Right eye: Anterior chamber quiet.     Left eye: Anterior chamber quiet.  Cardiovascular:     Rate and Rhythm: Normal rate and regular rhythm.     Heart sounds: No murmur heard. Pulmonary:     Effort: Pulmonary effort is normal. No respiratory distress.     Breath sounds: Normal breath sounds.  Abdominal:     Palpations: Abdomen is soft.     Tenderness: There is no abdominal tenderness.  Musculoskeletal:        General: No swelling.     Cervical back: Neck supple.  Skin:    General: Skin is warm and dry.     Capillary Refill:  Capillary refill takes less than 2 seconds.  Neurological:     General: No focal deficit present.     Mental Status: He is alert.    ED Results / Procedures / Treatments   Labs (all labs ordered are listed, but only abnormal results are displayed) Labs Reviewed  CBC - Abnormal; Notable for the following components:      Result Value   RBC 3.07 (*)    Hemoglobin 11.6 (*)    HCT 31.9 (*)    MCV 103.9 (*)    MCH 37.8 (*)    MCHC 36.4 (*)    Platelets 30 (*)    All other components within normal limits  URINALYSIS, ROUTINE W REFLEX MICROSCOPIC - Abnormal; Notable for the following components:   Color, Urine AMBER (*)    APPearance HAZY (*)    Glucose, UA >=500 (*)    Bilirubin Urine SMALL (*)    Ketones, ur 5 (*)    Protein, ur 30 (*)    Bacteria, UA FEW (*)    Non Squamous Epithelial 0-5 (*)    All other components within normal limits  LIPASE, BLOOD - Abnormal; Notable for the following components:   Lipase 60 (*)    All other components within normal limits  COMPREHENSIVE METABOLIC PANEL - Abnormal; Notable for the following components:   Sodium 133 (*)    Chloride 97 (*)    CO2 15 (*)    Glucose, Bld 148 (*)    Creatinine, Ser 1.35 (*)    Albumin 3.2 (*)    AST 174 (*)    ALT 67 (*)    Total Bilirubin 6.7 (*)    Anion gap 21 (*)    All other components within normal limits  ETHANOL - Abnormal; Notable for the following components:   Alcohol, Ethyl (B) 133 (*)    All other components within normal limits  PROTIME-INR - Abnormal; Notable for the following components:   Prothrombin Time 17.8 (*)    INR 1.5 (*)    All other components within normal limits  LIPID PANEL - Abnormal; Notable for the following components:   HDL 13 (*)    LDL Cholesterol 108 (*)    All other components within normal limits  AMMONIA - Abnormal; Notable for the following components:   Ammonia 62 (*)    All other components within normal limits  CBG MONITORING, ED - Abnormal; Notable for  the following components:   Glucose-Capillary 146 (*)    All other components within normal limits  POC OCCULT BLOOD, ED - Abnormal; Notable for the following components:  Fecal Occult Bld POSITIVE (*)    All other components within normal limits  RESP PANEL BY RT-PCR (FLU A&B, COVID) ARPGX2  HEPATITIS PANEL, ACUTE  HIV ANTIBODY (ROUTINE TESTING W REFLEX)  HEMOCHROMATOSIS DNA-PCR(C282Y,H63D)  COMPREHENSIVE METABOLIC PANEL  CBC  IRON AND TIBC  FERRITIN  TSH  RPR  MAGNESIUM  TYPE AND SCREEN  ABO/RH  TROPONIN I (HIGH SENSITIVITY)  TROPONIN I (HIGH SENSITIVITY)    EKG EKG Interpretation  Date/Time:  Thursday August 26 2021 09:58:25 EST Ventricular Rate:  99 PR Interval:  130 QRS Duration: 88 QT Interval:  370 QTC Calculation: 474 R Axis:   65 Text Interpretation: Normal sinus rhythm Normal ECG No previous ECGs available Confirmed by Aletta Edouard [phone]) on 08/26/2021 10:24:42 AM  Radiology CT Head Wo Contrast  Result Date: 08/26/2021 CLINICAL DATA:  Mental status change. EXAM: CT HEAD WITHOUT CONTRAST TECHNIQUE: Contiguous axial images were obtained from the base of the skull through the vertex without intravenous contrast. RADIATION DOSE REDUCTION: This exam was performed according to the departmental dose-optimization program which includes automated exposure control, adjustment of the mA and/or kV according to patient size and/or use of iterative reconstruction technique. COMPARISON:  None. FINDINGS: Brain: The ventricles are normal in size and configuration. No extra-axial fluid collections are identified. The gray-white differentiation is maintained. No CT findings for acute hemispheric infarction or intracranial hemorrhage. No mass lesions. The brainstem and cerebellum are normal. Vascular: No hyperdense vessels or obvious aneurysm. Skull: No acute skull fracture.  No bone lesion. Sinuses/Orbits: The paranasal sinuses and mastoid air cells are clear. The globes are intact.  Other: No scalp lesions, laceration or hematoma. IMPRESSION: No acute intracranial findings or mass lesions. Electronically Signed   By: Marijo Sanes M.D.   On: 08/26/2021 11:05   US Abdomen Limited RUQ (LIVER/GB)  Result Date: 08/26/2021 CLINICAL DATA:  Elevated liver function tests EXAM: ULTRASOUND ABDOMEN LIMITED RIGHT UPPER QUADRANT COMPARISON:  03/22/2010 FINDINGS: Gallbladder: No wall thickening or focal tenderness. No detected calculi, small volume sludge is possible. Common bile duct: Diameter: 5 mm Liver: Echogenic, heterogeneous, and poorly transmitting echoes. Nodular appearance of the surface is well. Portal vein is patent on color Doppler imaging with normal direction of blood flow towards the liver. IMPRESSION: 1. Cirrhotic appearance of the liver. 2. Cholelithiasis. Electronically Signed   By: Jorje Guild M.D.   On: 08/26/2021 12:01    Procedures Procedures    Medications Ordered in ED Medications  escitalopram (LEXAPRO) tablet 10 mg (has no administration in time range)  0.9 %  sodium chloride infusion ( Intravenous New Bag/Given [id number])  folic acid (FOLVITE) tablet 1 mg (1 mg Oral Given 08/26/21 1731)  multivitamin with minerals tablet 1 tablet (1 tablet Oral Given 08/26/21 1731)  thiamine tablet 100 mg ( Oral See Alternative 08/26/21 1733)    Or  thiamine (B-1) injection 100 mg (100 mg Intravenous Given 08/26/21 1733)  pantoprazole (PROTONIX) injection 40 mg (has no administration in time range)  lactulose (CHRONULAC) 10 GM/15ML solution 30 g (has no administration in time range)  sodium chloride 0.9 % bolus 1,000 mL (0 mLs Intravenous Stopped 08/26/21 1212)  pantoprazole (PROTONIX) injection 40 mg (40 mg Intravenous Given 08/26/21 1421)  LORazepam (ATIVAN) tablet 1 mg (1 mg Oral Given 08/26/21 1816)    ED Course/ Medical Decision Making/ A&P Clinical Course as of 08/26/21 2012  Thu Aug 26, 2021  1110 CT head does not show any acute mass or bleed.  Awaiting radiology reading  [  MB]  1228 Patient states he drinks 3-4 beers a day and 8 or 10 shots.  Has not seen any bleeding.  Rectal exam done with nurse Loma Sousa as chaperone.  Normal tone no masses.  Sample sent to lab for guaiac. [MB]  [id number] Discussed with Dr. Almyra Free from GI.  He agrees with admission to the hospital and he will see in consult [MB]  1414 Discussed with family practice teaching service who will evaluate patient for admission. [MB]    Clinical Course User Index [MB] Hayden Rasmussen, MD                           Medical Decision Making Amount and/or Complexity of Data Reviewed Labs: ordered. Radiology: ordered.  Risk Prescription drug management. Decision regarding hospitalization.  This patient complains of reflux, dizziness lightheadedness, orange vision; this involves an extensive number of treatment Options and is a complaint that carries with it a high risk of complications and morbidity. The differential includes GERD, peptic ulcer disease, gastritis, cholelithiasis, cholecystitis, stroke, bleed, metabolic derangement, dehydration, liver failure  I ordered, reviewed and interpreted labs, which included CBC with normal white count, hemoglobin low from priors, platelets low, chemistries with elevated creatinine elevated LFTs and T. bili Ruben, COVID and flu negative, fecal occult positive.  Acute hepatitis panel and alcohol ordered, alcohol elevated, ammonia mildly elevated I ordered medication IV fluids, IV PPI and reviewed PMP when indicated. I ordered imaging studies which included CT head and right upper quadrant ultrasound and I independently    visualized and interpreted imaging which showed cholelithiasis, concerns for cirrhosis Previous records obtained and reviewed no recent admissions I consulted GI Dr. Almyra Free and family practice teaching service and discussed lab and imaging findings and discussed disposition.  Cardiac monitoring reviewed, sinus tachycardia Social determinants  considered, ongoing alcohol and tobacco abuse Critical Interventions: None  After the interventions stated above, I reevaluated the patient and found patient with clear mental status. 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  Patient in agreement with plan for admission.          Final Clinical Impression(s) / ED Diagnoses Final diagnoses:  LFT elevation  Alcoholic cirrhosis, unspecified whether ascites present ([place])  Dizziness  AKI (acute kidney injury) ([place])  Thrombocytopenia ([facility])  Heme positive stool    Rx / DC Orders ED Discharge Orders     None         Hayden Rasmussen, MD 08/26/21 2016

## 2021-08-26 NOTE — ED Provider Notes (Signed)
? ?[facility] ?Provider Note ? ?Patient Contact: 9:05 AM (approximate) ? ? ?History  ? ?Dizziness ? ? ?HPI ? ?Jamie Chavez is a 51 y.o. male with a history of alcohol use, thrombocytopenia and GERD presents to the urgent care with multiple medical concerns.  Patient states that he is primarily concerned as he has headache, lightheadedness and changes in speech as reported by patient's coworkers.  He states that he feels somewhat confused.  He denies fever, chest pain, chest tightness, nausea, vomiting or abdominal pain.  Denies experiencing similar pain in the past.  Denies falls or mechanisms of trauma. ? ?  ? ? ?Physical Exam  ? ?Triage Vital Signs: ?ED Triage Vitals  ?Enc Vitals Group  ?   BP 08/26/21 0854 115/68  ?   Pulse Rate 08/26/21 0854 95  ?   Resp 08/26/21 0854 18  ?   Temp 08/26/21 0854 98.3 ?F (36.8 ?C)  ?   Temp Source 08/26/21 0854 Oral  ?   SpO2 08/26/21 0854 100 %  ?   Weight --   ?   Height --   ?   Head Circumference --   ?   Peak Flow --   ?   Pain Score 08/26/21 0849 0  ?   Pain Loc --   ?   Pain Edu? --   ?   Excl. in GC? --   ? ? ?Most recent vital signs: ?Vitals:  ? 08/26/21 0854  ?BP: 115/68  ?Pulse: 95  ?Resp: 18  ?Temp: 98.3 ?F (36.8 ?C)  ?SpO2: 100%  ? ? ? ?General: Alert and in no acute distress. ?Eyes:  PERRL. EOMI. ?Head: No acute traumatic findings ?ENT: ?     Nose: No congestion/rhinnorhea. ?     Mouth/Throat: Mucous membranes are moist. ?Neck: No stridor. No cervical spine tenderness to palpation. ?Cardiovascular:  Good peripheral perfusion ?Respiratory: Normal respiratory effort without tachypnea or retractions. Lungs CTAB. Good air entry to the bases with no decreased or absent breath sounds. ?Gastrointestinal: Bowel sounds ?4 quadrants. Soft and nontender to palpation. No guarding or rigidity. No palpable masses. No distention. No CVA tenderness. ?Musculoskeletal: Patient has symmetric strength in the upper and lower extremities.  Full range of motion to  all extremities.  ?Neurologic:  No gross focal neurologic deficits are appreciated.  ?Skin:   No rash noted ?Other: ? ? ?ED Results / Procedures / Treatments  ? ?Labs ?(all labs ordered are listed, but only abnormal results are displayed) ?Labs Reviewed - No data to display ? ? ? ? ? ? ?PROCEDURES: ? ?Critical Care performed: No ? ?Procedures ? ? ?MEDICATIONS ORDERED IN ED: ?Medications - No data to display ? ? ?IMPRESSION / MDM / ASSESSMENT AND PLAN / ED COURSE  ?I reviewed the triage vital signs and the nursing notes. ?             ?               ?Assessment and plan ?Headache ?Dizziness ?Differential diagnosis includes, but is not limited to, intracranial bleed, thrombocytopenia, medication side effect electrolyte abnormality, encephalopathy ? ?51 year old male with a history of alcohol use and thrombocytopenia presents to the urgent care with headache and reports of change speech at work.  He states that he feels dizzy, lightheaded and confused. ? ?Vital signs are reassuring at triage.  On physical exam, patient was alert, active and nontoxic-appearing with no neurodeficits noted.  Explained to patient that we  do not have access to CT until 2:00 PM I recommended that patient seek care in the emergency department as I do feel that an emergent head CT is warranted given patient's chief complaints.  Patient states that he has a driver who can take him to the emergency department. ?  ? ? ?FINAL CLINICAL IMPRESSION(S) / ED DIAGNOSES  ? ?Final diagnoses:  ?Acute nonintractable headache, unspecified headache type  ?Dizziness  ? ? ? ?Rx / DC Orders  ? ?ED Discharge Orders   ? ? None  ? ?  ? ? ? ?Note:  This document was prepared using Dragon voice recognition software and may include unintentional dictation errors. ?  ?Orvil Feil, PA-C ?08/26/21 8182 ? ?

## 2021-08-26 NOTE — ED Triage Notes (Addendum)
Started feeling bad 2 weeks ago.  Patient takes omeprazole regularly, recently ran out and had issues.  Spoke to provider and was placed on pantoprazole.  Started taking this 2 days ago.  Today has dry mouth, dizzy, absolutely no pain.   ? ?Patient states he sees spots and floaters .  Patient has never been to an eye provider.   ? ? ?Patient is out of hctz for 2 days ?Patient thinks symptoms related to pantoprazole ?

## 2021-08-27 ENCOUNTER — Encounter (HOSPITAL_COMMUNITY): Payer: Self-pay | Admitting: Family Medicine

## 2021-08-27 DIAGNOSIS — K7010 Alcoholic hepatitis without ascites: Secondary | ICD-10-CM | POA: Diagnosis not present

## 2021-08-27 DIAGNOSIS — R7989 Other specified abnormal findings of blood chemistry: Secondary | ICD-10-CM

## 2021-08-27 DIAGNOSIS — K7030 Alcoholic cirrhosis of liver without ascites: Secondary | ICD-10-CM | POA: Diagnosis not present

## 2021-08-27 DIAGNOSIS — N179 Acute kidney failure, unspecified: Secondary | ICD-10-CM | POA: Diagnosis not present

## 2021-08-27 DIAGNOSIS — R42 Dizziness and giddiness: Secondary | ICD-10-CM

## 2021-08-27 DIAGNOSIS — R195 Other fecal abnormalities: Secondary | ICD-10-CM

## 2021-08-27 DIAGNOSIS — D696 Thrombocytopenia, unspecified: Secondary | ICD-10-CM

## 2021-08-27 LAB — CBC
HCT: 25.9 % — ABNORMAL LOW (ref 39.0–52.0)
HCT: 26.7 % — ABNORMAL LOW (ref 39.0–52.0)
Hemoglobin: 9.4 g/dL — ABNORMAL LOW (ref 13.0–17.0)
Hemoglobin: 9.6 g/dL — ABNORMAL LOW (ref 13.0–17.0)
MCH: 37.6 pg — ABNORMAL HIGH (ref 26.0–34.0)
MCH: 37.5 pg — ABNORMAL HIGH (ref 26.0–34.0)
MCHC: 36.3 g/dL — ABNORMAL HIGH (ref 30.0–36.0)
MCHC: 36 g/dL (ref 30.0–36.0)
MCV: 103.6 fL — ABNORMAL HIGH (ref 80.0–100.0)
MCV: 104.3 fL — ABNORMAL HIGH (ref 80.0–100.0)
Platelets: 24 10*3/uL — CL (ref 150–400)
Platelets: 24 10*3/uL — CL (ref 150–400)
RBC: 2.5 MIL/uL — ABNORMAL LOW (ref 4.22–5.81)
RBC: 2.56 MIL/uL — ABNORMAL LOW (ref 4.22–5.81)
RDW: 15 % (ref 11.5–15.5)
RDW: 15.2 % (ref 11.5–15.5)
WBC: 3.8 10*3/uL — ABNORMAL LOW (ref 4.0–10.5)
WBC: 3.9 10*3/uL — ABNORMAL LOW (ref 4.0–10.5)
nRBC: 0.5 % — ABNORMAL HIGH (ref 0.0–0.2)
nRBC: 0 % (ref 0.0–0.2)

## 2021-08-27 LAB — COMPREHENSIVE METABOLIC PANEL
ALT: 62 U/L — ABNORMAL HIGH (ref 0–44)
ALT: 67 U/L — ABNORMAL HIGH (ref 0–44)
AST: 163 U/L — ABNORMAL HIGH (ref 15–41)
AST: 186 U/L — ABNORMAL HIGH (ref 15–41)
Albumin: 2.7 g/dL — ABNORMAL LOW (ref 3.5–5.0)
Albumin: 2.8 g/dL — ABNORMAL LOW (ref 3.5–5.0)
Alkaline Phosphatase: 74 U/L (ref 38–126)
Alkaline Phosphatase: 72 U/L (ref 38–126)
Anion gap: 12 (ref 5–15)
Anion gap: 11 (ref 5–15)
BUN: 16 mg/dL (ref 6–20)
BUN: 15 mg/dL (ref 6–20)
CO2: 21 mmol/L — ABNORMAL LOW (ref 22–32)
CO2: 21 mmol/L — ABNORMAL LOW (ref 22–32)
Calcium: 8.2 mg/dL — ABNORMAL LOW (ref 8.9–10.3)
Calcium: 8.2 mg/dL — ABNORMAL LOW (ref 8.9–10.3)
Chloride: 100 mmol/L (ref 98–111)
Chloride: 103 mmol/L (ref 98–111)
Creatinine, Ser: 0.85 mg/dL (ref 0.61–1.24)
Creatinine, Ser: 0.86 mg/dL (ref 0.61–1.24)
GFR, Estimated: >60 mL/min (ref >60)
GFR, Estimated: >60 mL/min (ref >60)
Glucose, Bld: 124 mg/dL — ABNORMAL HIGH (ref 70–99)
Glucose, Bld: 141 mg/dL — ABNORMAL HIGH (ref 70–99)
Potassium: 4 mmol/L (ref 3.5–5.1)
Potassium: 4 mmol/L (ref 3.5–5.1)
Sodium: 133 mmol/L — ABNORMAL LOW (ref 135–145)
Sodium: 135 mmol/L (ref 135–145)
Total Bilirubin: 8.2 mg/dL — ABNORMAL HIGH (ref 0.3–1.2)
Total Bilirubin: 9 mg/dL — ABNORMAL HIGH (ref 0.3–1.2)
Total Protein: 6.5 g/dL (ref 6.5–8.1)
Total Protein: 7 g/dL (ref 6.5–8.1)

## 2021-08-27 LAB — MAGNESIUM: Magnesium: 2.2 mg/dL (ref 1.7–2.4)

## 2021-08-27 LAB — AMMONIA: Ammonia: 65 umol/L — ABNORMAL HIGH (ref 9–35)

## 2021-08-27 LAB — RAPID URINE DRUG SCREEN, HOSP PERFORMED
Amphetamines: NOT DETECTED
Barbiturates: NOT DETECTED
Benzodiazepines: NOT DETECTED
Cocaine: NOT DETECTED
Opiates: NOT DETECTED
Tetrahydrocannabinol: NOT DETECTED

## 2021-08-27 LAB — IRON AND TIBC
Iron: 162 ug/dL (ref 45–182)
Saturation Ratios: 106 % — ABNORMAL HIGH (ref 17.9–39.5)
TIBC: 153 ug/dL — ABNORMAL LOW (ref 250–450)

## 2021-08-27 LAB — RPR: RPR Ser Ql: NONREACTIVE

## 2021-08-27 LAB — PHOSPHORUS: Phosphorus: 2.2 mg/dL — ABNORMAL LOW (ref 2.5–4.6)

## 2021-08-27 LAB — TSH: TSH: 2.485 u[IU]/mL (ref 0.350–4.500)

## 2021-08-27 LAB — FERRITIN: Ferritin: 4075 ng/mL — ABNORMAL HIGH (ref 24–336)

## 2021-08-27 MED ORDER — CHLORDIAZEPOXIDE HCL 25 MG PO CAPS: 25.0000 mg | ORAL_CAPSULE | Freq: Four times a day (QID) | ORAL | Status: AC | PRN

## 2021-08-27 MED ORDER — LORAZEPAM 2 MG/ML IJ SOLN: 1.0000 mg | INTRAMUSCULAR | Status: DC | PRN

## 2021-08-27 MED ORDER — CHLORDIAZEPOXIDE HCL 25 MG PO CAPS
25.0000 mg | ORAL_CAPSULE | Freq: Four times a day (QID) | ORAL | Status: AC
  Administered 2021-08-27 – 2021-08-28 (×4): 25 mg via ORAL
  Filled 2021-08-27 (×4): qty 1

## 2021-08-27 MED ORDER — LOPERAMIDE HCL 2 MG PO CAPS: 2.0000 mg | ORAL_CAPSULE | ORAL | Status: AC | PRN

## 2021-08-27 MED ORDER — SODIUM PHOSPHATES 45 MMOLE/15ML IV SOLN
30.0000 mmol | Freq: Once | INTRAVENOUS | Status: AC
  Administered 2021-08-27: 30 mmol via INTRAVENOUS
  Filled 2021-08-27: qty 10

## 2021-08-27 MED ORDER — LORAZEPAM 1 MG PO TABS
1.0000 mg | ORAL_TABLET | ORAL | Status: DC | PRN
  Administered 2021-08-27: 1 mg via ORAL
  Administered 2021-08-27: 2 mg via ORAL
  Administered 2021-08-27: 1 mg via ORAL
  Filled 2021-08-27: qty 1
  Filled 2021-08-27: qty 2
  Filled 2021-08-27: qty 1

## 2021-08-27 MED ORDER — CHLORDIAZEPOXIDE HCL 25 MG PO CAPS
50.0000 mg | ORAL_CAPSULE | Freq: Once | ORAL | Status: AC
  Administered 2021-08-27: 50 mg via ORAL
  Filled 2021-08-27: qty 2

## 2021-08-27 MED ORDER — NICOTINE 14 MG/24HR TD PT24
14.0000 mg | MEDICATED_PATCH | Freq: Every day | TRANSDERMAL | Status: DC
  Administered 2021-08-27 – 2021-08-31 (×4): 14 mg via TRANSDERMAL
  Filled 2021-08-27 (×5): qty 1

## 2021-08-27 MED ORDER — HYDROXYZINE HCL 25 MG PO TABS: 25.0000 mg | ORAL_TABLET | Freq: Four times a day (QID) | ORAL | Status: AC | PRN

## 2021-08-27 MED ORDER — ONDANSETRON 4 MG PO TBDP: 4.0000 mg | ORAL_TABLET | Freq: Four times a day (QID) | ORAL | Status: AC | PRN

## 2021-08-27 MED ORDER — CHLORDIAZEPOXIDE HCL 25 MG PO CAPS
25.0000 mg | ORAL_CAPSULE | Freq: Every day | ORAL | Status: AC
  Administered 2021-08-30: 25 mg via ORAL
  Filled 2021-08-27: qty 1

## 2021-08-27 MED ORDER — CHLORDIAZEPOXIDE HCL 25 MG PO CAPS
25.0000 mg | ORAL_CAPSULE | ORAL | Status: AC
  Administered 2021-08-29 – 2021-08-30 (×2): 25 mg via ORAL
  Filled 2021-08-27 (×2): qty 1

## 2021-08-27 MED ORDER — LORAZEPAM 2 MG/ML IJ SOLN
1.0000 mg | INTRAMUSCULAR | Status: AC | PRN
  Administered 2021-08-27 (×2): 3 mg via INTRAVENOUS
  Filled 2021-08-27 (×2): qty 2

## 2021-08-27 MED ORDER — ALBUTEROL SULFATE (2.5 MG/3ML) 0.083% IN NEBU: 2.5000 mg | INHALATION_SOLUTION | Freq: Four times a day (QID) | RESPIRATORY_TRACT | Status: DC | PRN

## 2021-08-27 MED ORDER — LORAZEPAM 1 MG PO TABS: 1.0000 mg | ORAL_TABLET | ORAL | Status: AC | PRN

## 2021-08-27 MED ORDER — LACTATED RINGERS IV SOLN: INTRAVENOUS | Status: DC

## 2021-08-27 MED ORDER — CHLORDIAZEPOXIDE HCL 25 MG PO CAPS
25.0000 mg | ORAL_CAPSULE | Freq: Three times a day (TID) | ORAL | Status: AC
  Administered 2021-08-28 – 2021-08-29 (×3): 25 mg via ORAL
  Filled 2021-08-27 (×3): qty 1

## 2021-08-27 NOTE — Progress Notes (Signed)
OT Cancellation Note ? ?Patient Details ?Name: Jamie Chavez ?MRN: [id number] ?DOB: April 09, 1971 ? ? ?Cancelled Treatment:    Reason Eval/Treat Not Completed: Patient not medically ready: MDs in room examining pt as OT waiting in hallway for Evaluation. As MDs exited room, they requested that OT hold off as pt not ready yet. MD was iformed that PT had already seen the pt, but MD did continue to ask for OT to hold off.  Will continue efforts.  ? ?Theodoro Clock ?08/27/2021, 11:25 AM ?

## 2021-08-27 NOTE — Consult Note (Addendum)
? ?NAME:  Jamie Chavez, MRN:  [id number], DOB:  14-Oct-1970, LOS: 1 ?ADMISSION DATE:  08/26/2021, CONSULTATION DATE:  3/10 ?REFERRING MD:  Dr. Lum Babe, CHIEF COMPLAINT:  AMS  ? ?History of Present Illness:  ?51 y/o M who presented to [facility] ER on 3/9 with reports of altered mental status by his co-workers.  ? ?The patient reports he drinks daily - reports he started drinking after the loss of his sister (died of complications of hemochromatosis), mother and father since 2019. He typically drinks 3-4 beers per day and 5-6 shots of liquor. He reports he has never had alcohol withdrawal symptoms.  His last drink prior to admit was 3/8.  Of note, he ran out of his omeprazole several days prior to presentation. The patient reported to an Urgent Care after his co-workers noted he had changes in his speech.  He also reported headache, lightheadedness, increased acid reflux symptoms & possible vomiting of blood (he was not sure on admit).  He was transferred to the ER for CT head imaging.  Alcohol level on presentation 133.  Other notable labs - na 133, Cl 97, CO2 15, BUN 20, Cr 1.35, AG 21, albumin 3.2, AST 174, ALT 67, WBC 3.8, hgb 9.4, MCV 103.9, platelets 30.  UDS negative. CT head was negative for acute process.  [place] abd showed findings consistent with cirrhotic liver. The patient was transferred to [facility] for GI evaluation by Dr. Elnoria Howard.  He was admitted per FPTS.  On 3/10, there were concerns for increasing withdrawal symptoms and PCCM consulted.  ? ?Chart review shows he had 4 mg of ativan in the last 24 hours, most recent of 2mg  at 0856.  ? ? ?Pertinent  Medical History  ?GERD ?HTN  ?ETOH Abuse  ? ?Significant Hospital Events: ?Including procedures, antibiotic start and stop dates in addition to other pertinent events   ?3/9 Admit with AMS/slurred speech (CT head negative) ?3/10 PCCM consulted for ETOH withdrawal symptoms ? ?Interim History / Subjective:  ?Afebrile  ?Denies pain/shortness of breath  ? ?Objective   ?Blood  pressure 134/64, pulse (!) 118, temperature 98 ?F (36.7 ?C), temperature source Oral, resp. rate 18, height 5\' 9"  (1.753 m), weight 91.9 kg, SpO2 93 %. ?   ?   ? ?Intake/Output Summary (Last 24 hours) at 08/27/2021 1134 ?Last data filed at 08/27/2021 0600 ?Gross per 24 hour  ?Intake 2702.89 ml  ?Output 350 ml  ?Net 2352.89 ml  ? ?Filed Weights  ? 08/26/21 1039 08/27/21 0230  ?Weight: 90.7 kg 91.9 kg  ? ? ?Examination: ?General: chronically ill appearing adult male lying in bed in NAD, picking at bed linens ?HENT: jaundice, icterus noted, pupils =/reactive, speech clear ?Lungs: non-labored at rest, mild tachypnea but no accessory muscle use, clear bilaterally  ?Cardiovascular: S1S2 RRR, ST on monitor ?Abdomen: protuberant, active bowel sounds  ?Extremities: warm/dry, no edema  ?Neuro: awakens to voice, speech clear, oriented to self, place, MAE.  ? ?Resolved Hospital Problem list   ? ? ?Assessment & Plan:  ? ?ETOH Withdrawal ?-add librium taper  ?-add CIWA protocol, needs to be utilized  ?-continue thiamine, folate, MVI ? ?Reactive Tachycardia  ?-LR at 40ml/hr  ? ?Alcoholic Cirrhosis  ?Alcoholic Hepatitis  ?Thrombocytopenia, Anemia  ?Cirrhosis on 10/27/21 this admit.  Noted to have hepatosplenomegaly in 2011.  Heme positive.   ?-GI following, appreciate Dr. [place] input  ?-trend LFT's ?-continue PPI  ?-follow CBC  ?-transfuse for Hgb <7% or active bleeding ?-continue lactulose, titrate to effect (goal  for several BM per day) ?-anemia work up per primary  ? ?AKI ?Hyponatremia  ?Hypophosphatemia ?-Trend BMP / urinary output ?-Replace electrolytes as indicated, phos/mg+ given 3/10 ?-Avoid nephrotoxic agents, ensure adequate renal perfusion ? ?HTN  ?-per primary  ? ?Moderate Protein Calorie Malnutrition  ?-supportive care, push nutrition as able  ? ?Best Practice (right click and "Reselect all SmartList Selections" daily)  ?Per Primary  ? ?Labs   ?CBC: ?Recent Labs  ?Lab 08/26/21 ?1002 08/27/21 ?0636  ?WBC 6.0 3.8*  ?HGB 11.6*  9.4*  ?HCT 31.9* 25.9*  ?MCV 103.9* 103.6*  ?PLT 30* 24*  ? ? ?Basic Metabolic Panel: ?Recent Labs  ?Lab 08/26/21 ?1002 08/26/21 ?1920 08/27/21 ?0636  ?NA 133*  --  133*  ?K 4.2  --  4.0  ?CL 97*  --  100  ?CO2 15*  --  21*  ?GLUCOSE 148*  --  124*  ?BUN 20  --  16  ?CREATININE 1.35*  --  0.85  ?CALCIUM 8.9  --  8.2*  ?MG  --  1.2* 2.2  ?PHOS  --  2.2* 2.2*  ? ?GFR: ?Estimated Creatinine Clearance: 117.8 mL/min (by C-G formula based on SCr of 0.85 mg/dL). ?Recent Labs  ?Lab 08/26/21 ?1002 08/27/21 ?0636  ?WBC 6.0 3.8*  ? ? ?Liver Function Tests: ?Recent Labs  ?Lab 08/26/21 ?1002 08/27/21 ?0636  ?AST 174* 163*  ?ALT 67* 62*  ?ALKPHOS 86 74  ?BILITOT 6.7* 8.2*  ?PROT 7.8 6.5  ?ALBUMIN 3.2* 2.7*  ? ?Recent Labs  ?Lab 08/26/21 ?1002  ?LIPASE 60*  ? ?Recent Labs  ?Lab 08/26/21 ?1738  ?AMMONIA 62*  ? ? ?ABG ?No results found for: PHART, PCO2ART, PO2ART, HCO3, TCO2, ACIDBASEDEF, O2SAT  ? ?Coagulation Profile: ?Recent Labs  ?Lab 08/26/21 ?1738  ?INR 1.5*  ? ? ?Cardiac Enzymes: ?No results for input(s): CKTOTAL, CKMB, CKMBINDEX, TROPONINI in the last 168 hours. ? ?HbA1C: ?No results found for: HGBA1C ? ?CBG: ?Recent Labs  ?Lab 08/26/21 ?1001  ?GLUCAP 146*  ? ? ?Review of Systems: Positives in [place]  ?Gen: Denies fever, chills, weight change, fatigue, night sweats ?HEENT: Denies blurred vision, double vision, hearing loss, tinnitus, sinus congestion, rhinorrhea, sore throat, neck stiffness, dysphagia ?PULM: Denies shortness of breath, cough, sputum production, hemoptysis, wheezing ?CV: Denies chest pain, edema, orthopnea, paroxysmal nocturnal dyspnea, palpitations ?GI: Denies abdominal pain, nausea, vomiting with "possible blood in it", diarrhea, hematochezia, melena, constipation, change in bowel habits ?GU: Denies dysuria, hematuria, polyuria, oliguria, urethral discharge ?Endocrine: Denies hot or cold intolerance, polyuria, polyphagia or appetite change ?Derm: Denies rash, dry skin, scaling or peeling skin change ?Heme:  Denies easy bruising, bleeding, bleeding gums ?Neuro: Denies headache, numbness, weakness, slurred speech, loss of memory or consciousness ? ?Past Medical History:  ?He,  has a past medical history of GERD (gastroesophageal reflux disease) and Hypertension.  ? ?Surgical History:  ? ?Past Surgical History:  ?Procedure Laterality Date  ? ARTHROSCOPIC REPAIR ACL    ? SHOULDER ARTHROSCOPY    ?  ? ?Social History:  ? reports that he has been smoking cigarettes. He has been smoking an average of 1 pack per day. He has never used smokeless tobacco. He reports current alcohol use of about 21.0 standard drinks per week. He reports that he does not use drugs.  ? ?Family History:  ?His family history includes Liver disease in his sister.  ? ?Allergies ?No Known Allergies  ? ?Home Medications  ?Prior to Admission medications   ?Medication Sig Start Date End Date  Taking? Authorizing Provider  ?escitalopram (LEXAPRO) 10 MG tablet Take 10 mg by mouth daily as needed (anxiety).   Yes [provider]  ?loperamide (IMODIUM A-D) 2 MG tablet Take 2 mg by mouth daily.   Yes [provider]  ?pantoprazole (PROTONIX) 40 MG tablet Take 1 tablet (40 mg total) by mouth 2 (two) times daily before a meal. 08/23/21  Yes Cook, Jayce G, DO  ?valsartan (DIOVAN) 160 MG tablet Take 1 tablet (160 mg total) by mouth daily. With 40mg  tablet, to equal 200mg  daily. 01/14/21  Yes Campbell RichesHoskins, Carolyn C, NP  ?valsartan (DIOVAN) 40 MG tablet TAKE 1 TABLET BY MOUTH ONCE DAILY ALONG WITH 160MG  TO EQUAL 200MG  ?Patient taking differently: Take 40 mg by mouth daily. TAKE WITH 160MG  TO EQUAL 200MG  01/14/21  Yes Campbell RichesHoskins, Carolyn C, NP  ?hydrochlorothiazide (HYDRODIURIL) 25 MG tablet Take 1 tablet (25 mg total) by mouth daily. 01/14/21   Campbell RichesHoskins, Carolyn C, NP  ?  ? ?Critical care time: n/a ?  ? ?Canary BrimBrandi Jammie Clink, MSN, APRN, NP-C, AGACNP-BC ?[facility] ?08/27/2021, 11:36 AM ? ? ?Please see [email or web address] for pager details.  ? ?From 7A-7P if no  response, please call [phone] ?After hours, please call [facility] [phone] ? ? ? ? ?

## 2021-08-27 NOTE — Progress Notes (Addendum)
Subjective: ?No acute events.  Feeling anxious this AM.  He had diarrhea with the lactulose. ? ?Objective: ?Vital signs in last 24 hours: ?Temp:  [98.1 ?F (36.7 ?C)-98.6 ?F (37 ?C)] 98.2 ?F (36.8 ?C) (03/10 0300) ?Pulse Rate:  [95-123] 107 (03/10 0600) ?Resp:  [11-23] 21 (03/10 0600) ?BP: (112-162)/(52-87) 120/67 (03/10 0600) ?SpO2:  [90 %-100 %] 94 % (03/10 0600) ?Weight:  [90.7 kg-91.9 kg] 91.9 kg (03/10 0230) ?Last BM Date : 08/27/21 ? ?Intake/Output from previous day: ?03/09 0701 - 03/10 0700 ?In: 2702.9 [I.V.:1441.6; IV Piggyback:1261.3] ?Out: 350 [Urine:350] ?Intake/Output this shift: ?No intake/output data recorded. ? ?General appearance: alert and no distress ?GI: soft, non-tender; bowel sounds normal; no masses,  no organomegaly ?Extremities: No asterixis ? ?Lab Results: ?Recent Labs  ?  08/26/21 ?1002  ?WBC 6.0  ?HGB 11.6*  ?HCT 31.9*  ?PLT 30*  ? ?BMET ?Recent Labs  ?  08/26/21 ?1002 08/27/21 ?0636  ?NA 133* 133*  ?K 4.2 4.0  ?CL 97* 100  ?CO2 15* 21*  ?GLUCOSE 148* 124*  ?BUN 20 16  ?CREATININE 1.35* 0.85  ?CALCIUM 8.9 8.2*  ? ?LFT ?Recent Labs  ?  08/27/21 ?0636  ?PROT 6.5  ?ALBUMIN 2.7*  ?AST 163*  ?ALT 62*  ?ALKPHOS 74  ?BILITOT 8.2*  ? ?PT/INR ?Recent Labs  ?  08/26/21 ?1738  ?LABPROT 17.8*  ?INR 1.5*  ? ?Hepatitis Panel ?Recent Labs  ?  08/26/21 ?1738  ?HEPBSAG NON REACTIVE  ?HCVAB NON REACTIVE  ?HEPAIGM NON REACTIVE  ?HEPBIGM NON REACTIVE  ? ?C-Diff ?No results for input(s): CDIFFTOX in the last 72 hours. ?Fecal Lactopherrin ?No results for input(s): FECLLACTOFRN in the last 72 hours. ? ?Studies/Results: ?CT Head Wo Contrast ? ?Result Date: 08/26/2021 ?CLINICAL DATA:  Mental status change. EXAM: CT HEAD WITHOUT CONTRAST TECHNIQUE: Contiguous axial images were obtained from the base of the skull through the vertex without intravenous contrast. RADIATION DOSE REDUCTION: This exam was performed according to the departmental dose-optimization program which includes automated exposure control, adjustment  of the mA and/or kV according to patient size and/or use of iterative reconstruction technique. COMPARISON:  None. FINDINGS: Brain: The ventricles are normal in size and configuration. No extra-axial fluid collections are identified. The gray-white differentiation is maintained. No CT findings for acute hemispheric infarction or intracranial hemorrhage. No mass lesions. The brainstem and cerebellum are normal. Vascular: No hyperdense vessels or obvious aneurysm. Skull: No acute skull fracture.  No bone lesion. Sinuses/Orbits: The paranasal sinuses and mastoid air cells are clear. The globes are intact. Other: No scalp lesions, laceration or hematoma. IMPRESSION: No acute intracranial findings or mass lesions. Electronically Signed   By: Marijo Sanes M.D.   On: 08/26/2021 11:05  ? ?US Abdomen Limited RUQ (LIVER/GB) ? ?Result Date: 08/26/2021 ?CLINICAL DATA:  Elevated liver function tests EXAM: ULTRASOUND ABDOMEN LIMITED RIGHT UPPER QUADRANT COMPARISON:  03/22/2010 FINDINGS: Gallbladder: No wall thickening or focal tenderness. No detected calculi, small volume sludge is possible. Common bile duct: Diameter: 5 mm Liver: Echogenic, heterogeneous, and poorly transmitting echoes. Nodular appearance of the surface is well. Portal vein is patent on color Doppler imaging with normal direction of blood flow towards the liver. IMPRESSION: 1. Cirrhotic appearance of the liver. 2. Cholelithiasis. Electronically Signed   By: Jorje Guild M.D.   On: 08/26/2021 12:01   ? ?Medications: Scheduled: ? folic acid  1 mg Oral Daily  ? lactulose  30 g Oral TID  ? multivitamin with minerals  1 tablet Oral Daily  ?  pantoprazole (PROTONIX) IV  40 mg Intravenous Q12H  ? thiamine  100 mg Oral Daily  ? Or  ? thiamine  100 mg Intravenous Daily  ? ?Continuous: ? sodium chloride 125 mL/hr at 08/27/21 0235  ? ? ?Assessment/Plan: ?1) ETOH hepatitis. ?2) ETOH cirrhosis. ?3) ETOH abuse. ?4) Thrombocytopenia. ?5) Heme positive stool. ?6) Anemia -  unknown baseline. ? ? The patient appears more tremulous than yesterday.  He is tachycardic and he had end expiratory wheezing, mild, with the examination today.  No complaints of SOB.  He may be exhibiting some withdrawal.  His liver enzymes are mildly improved and the HGB is pending for this AM.  There is no evidence of asterixis this AM.  A ferritin level was drawn on 10/09/2015 and it was at 547, which is not at a level for concern.  Fatty liver is associated with elevation in iron levels.  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  The HFE gene test was ordered. ? ?Plan: ?1) Continue with lactulose. ?2) Continue with supportive care. ?3) No immediate EGD/colonoscopy. ?4) HFE blood work pending. ?5) [facility] will be covering this weekend. ? LOS: 1 day  ? ?Yashar Inclan D ?08/27/2021, 7:26 AM  ?

## 2021-08-27 NOTE — Progress Notes (Addendum)
The patient denies any concerns this morning. No change in his mental status and his examinations remain grossly unchanged. ?Of note, Barbaraann Boys documented on file that she contacted me with a critical test result around 7:40 AM, although I never received a page from her regarding this critical result. I later saw that she sent me a secure chat message one hour later, around 8:40 AM, regarding the critical result. ?The management plan was discussed with the resident at the time. ? ?During rounds, he became more agitated, and Dr. Melissa Noon left rounds to assess him. The [facility] team and I went to reassess him after rounds. ? ?Exam: ?Gen: Mild to moderate agitation. Intermittent confusion. ?Neuro: He is oriented to self, place, and person. No dysphagia, no facial asymmetry. CNs intact, ++bicepts DTRs. Gait was not assessed. ?Heart/Lung: wnl when assessed previously. ?Skin: mild to moderate itcterus unchanged. ? ?A/P: ? ?AMS/ETOH abuse: ?Metabolic encephalopathy in the settings of alcohol withdrawal and liver cirrhosis. ?Intermittent delirium is likely associated with ETOH withdrawal. ?No neurologic deficit on exam. I am holding off on neuro-imaging at this time. ?CCM consulted to evaluate for management of his withdrawal with Precedex. ?Stat CBC, Cmet, and ammonia level. ?He needs close monitoring and neuro checks. ?Place on 1:1 observation. ?Continue CIWA protocol. ?Obtain CT head if neurologic status worsens - residents are aware. ? ?Macrocytic anemia/Thrombocytopenia: ?Lab reviewed - now pancytopenia likely due to hemodilution from fluid received yesterday. ?A repeat lab was initially scheduled for the afternoon. However, due to his recent change in status, stat repeat labs were ordered. ?No acute bleed at the time. ?Drop in hemoglobin likely due to hemodilution. ?Transfusion threshold of hemoglobin <7. ?The transfusion threshold for platelet is < 10 or if there is bleeding. ?

## 2021-08-27 NOTE — Progress Notes (Addendum)
FPTS Interim Progress Note ? ?S: Called to patient's room regarding worsening altered mental status.  I evaluated the patient this morning and he is oriented to person and place.  He responded appropriately to questions and denied any hallucinations.  ? ?Evaluated the patient with Dr.Dameron. on my arrival to the room for this visit patient was sitting in chair next to bed.  He had worsening scleral icterus and jaundice of the skin from earlier in the day.  When asked if he knew where he was he reported "me and my body were getting ready to go down take a right on [address] and go to [place]".  When asked the year he states "is a 2024, no 2022".  When asked about hallucinations his statements are insensate goal involving cigarettes falling and him trying to catch them.  He denies any pain anywhere. ? ?O: ?BP 134/64   Pulse (!) 118   Temp 98 ?F (36.7 ?C) (Oral)   Resp 18   Ht 5\' 9"  (1.753 m)   Wt 91.9 kg   SpO2 93%   BMI 29.92 kg/m?   ?General: Ill-appearing 51 year old male sitting up in chair ?HEENT: Scleral icterus, no nystagmus appreciated ?Cardiac: Tachycardic in the 130s-140s ?Respiratory: Normal work of breathing on room air, speaking in full sentences ?Neuro: Altered mental status, fidgety, resting tremor with that worsens with movement, cranial nerves intact, upper motor strength intact and equal bilaterally, 5/5 ? ?A/P: ?AMS 2/2 EtOH withdrawal ?Worsening AMS concerning for hepatic encephalopathy.  Vital signs show tachycardia but are otherwise stable.  Repeating ammonia level, CMP, we were planning on repeating H&H this afternoon but will obtain it now.  Spoke with attending and have contacted CCM for evaluation.  Hemodynamically stable at this time but given trajectory of his symptoms may require ICU admission.  Appreciate CCM's assistance in caring for this patient. ? ?54, MD ?08/27/2021, 11:09 AM ?PGY-3, [facility] ?Service pager [phone] ? ?

## 2021-08-27 NOTE — Evaluation (Signed)
Physical Therapy Evaluation ?Patient Details ?Name: Jamie Chavez ?MRN: [id number] ?DOB: [birth year] ?Today's Date: 08/27/2021 ? ?History of Present Illness ? Jamie Chavez is a 51 y.o. male presenting with dizziness, diminished appetite and GI discomfort, found to have thrombocytopenia, alcoholic hepatitis and cirrhosis on laboratory and imaging studies, now with worsening thrombocytopenia.  PMH is significant for HTN, GERD, alcohol use, tobacco use, hx depression/anxiety.  ?Clinical Impression ? Pt admitted with above diagnosis. Pt was able to transfer to chair with min assist. Pt reported dizziness and did not feel like he could progress ambulation today.  Pt should progress well.  Pt currently with functional limitations due to the deficits listed below (see PT Problem List). Pt will benefit from skilled PT to increase their independence and safety with mobility to allow discharge to the venue listed below.      ?   ? ?Recommendations for follow up therapy are one component of a multi-disciplinary discharge planning process, led by the attending physician.  Recommendations may be updated based on patient status, additional functional criteria and insurance authorization. ? ?Follow Up Recommendations Home health PT ? ?  ?Assistance Recommended at Discharge Set up Supervision/Assistance  ?Patient can return home with the following ? A little help with walking and/or transfers ? ?  ?Equipment Recommendations None recommended by PT  ?Recommendations for Other Services ?    ?  ?Functional Status Assessment Patient has had a recent decline in their functional status and demonstrates the ability to make significant improvements in function in a reasonable and predictable amount of time.  ? ?  ?Precautions / Restrictions Precautions ?Precautions: Fall  ? ?  ? ?Mobility ? Bed Mobility ?Overal bed mobility: Needs Assistance ?Bed Mobility: Supine to Sit ?  ?  ?Supine to sit: Min assist ?  ?  ?General bed mobility comments: a  little assist to come to EOB wtih trunk elevation and balance ?  ? ?Transfers ?Overall transfer level: Needs assistance ?Equipment used: 2 person hand held assist ?Transfers: Sit to/from Stand, Bed to chair/wheelchair/BSC ?Sit to Stand: Min assist, From elevated surface ?  ?Step pivot transfers: Min assist ?  ?  ?  ?General transfer comment: Pt needed a little assist to stand.  Pt was able to step to chair with a little assist for stabiity as pt slihgtly dizzy per pt.  Pt did not feel like he could walk to door today per pt. ?  ? ?Ambulation/Gait ?  ?  ?  ?  ?  ?  ?  ?  ? ?Stairs ?  ?  ?  ?  ?  ? ?Wheelchair Mobility ?  ? ?Modified Rankin (Stroke Patients Only) ?  ? ?  ? ?Balance Overall balance assessment: Needs assistance ?Sitting-balance support: No upper extremity supported, Feet supported ?Sitting balance-Leahy Scale: Fair ?  ?  ?Standing balance support: Bilateral upper extremity supported, During functional activity ?Standing balance-Leahy Scale: Poor ?Standing balance comment: relied on UE support ?  ?  ?  ?  ?  ?  ?  ?  ?  ?  ?  ?   ? ? ? ?Pertinent Vitals/Pain Pain Assessment ?Pain Assessment: No/denies pain  ? ? ?Home Living Family/patient expects to be discharged to:: Private residence ?Living Arrangements: Alone ?Available Help at Discharge: Family;Available PRN/intermittently (mom checks onh pt but she works) ?Type of Home: House ?Home Access: Stairs to enter ?Entrance Stairs-Rails: Left ?Entrance Stairs-Number of Steps: 7 ?  ?Home Layout: Able to live on main  level with bedroom/bathroom ?Home Equipment: Shower seat;Cane - single point;Rolling Walker (2 wheels) ?Additional Comments: 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  ?  ?Prior Function Prior Level of Function : Independent/Modified Independent;Driving;Working/employed ?  ?  ?  ?  ?  ?  ?Mobility Comments: was able to walk around ?ADLs Comments: bathe and dress self ?  ? ? ?Hand Dominance  ? Dominant Hand: Right ? ?  ?Extremity/Trunk Assessment   ? Upper Extremity Assessment ?Upper Extremity Assessment: Defer to OT evaluation ?  ? ?Lower Extremity Assessment ?Lower Extremity Assessment: Generalized weakness ?  ? ?Cervical / Trunk Assessment ?Cervical / Trunk Assessment: Normal  ?Communication  ? Communication: No difficulties;Other (comment) (slurs speech at tijmes)  ?Cognition Arousal/Alertness: Awake/alert ?Behavior During Therapy: [facility] for tasks assessed/performed ?Overall Cognitive Status: Within Functional Limits for tasks assessed ?  ?  ?  ?  ?  ?  ?  ?  ?  ?  ?  ?  ?  ?  ?  ?  ?  ?  ?  ? ?  ?General Comments General comments (skin integrity, edema, etc.): 128 bpm, 95% RA, 130/68  123/83  125/79  134 bpm ? ?  ?Exercises General Exercises - Lower Extremity ?Ankle Circles/Pumps: AROM, Both, 5 reps, Supine ?Quad Sets: AROM, Both, 5 reps, Supine ?Heel Slides: AROM, Both, 5 reps, Supine ?Straight Leg Raises: AROM, Both, 5 reps, Supine  ? ?Assessment/Plan  ?  ?PT Assessment Patient needs continued PT services  ?PT Problem List Decreased activity tolerance;Decreased balance;Decreased mobility;Decreased knowledge of use of DME;Decreased safety awareness;Decreased knowledge of precautions;Cardiopulmonary status limiting activity ? ?   ?  ?PT Treatment Interventions Functional mobility training;Therapeutic activities;Therapeutic exercise;Balance training;Patient/family education;DME instruction;Gait training;Stair training   ? ?PT Goals (Current goals can be found in the Care Plan section)  ?Acute Rehab PT Goals ?Patient Stated Goal: to go home ?PT Goal Formulation: With patient ?Time For Goal Achievement: 09/10/21 ?Potential to Achieve Goals: Good ? ?  ?Frequency Min 3X/week ?  ? ? ?Co-evaluation   ?  ?  ?  ?  ? ? ?  ?AM-PAC PT "6 Clicks" Mobility  ?Outcome Measure Help needed turning from your back to your side while in a flat bed without using bedrails?: A Little ?Help needed moving from lying on your back to sitting on the side of a flat bed without using  bedrails?: A Little ?Help needed moving to and from a bed to a chair (including a wheelchair)?: A Little ?Help needed standing up from a chair using your arms (e.g., wheelchair or bedside chair)?: A Little ?Help needed to walk in hospital room?: A Little ?Help needed climbing 3-5 steps with a railing? : A Little ?6 Click Score: 18 ? ?  ?End of Session Equipment Utilized During Treatment: Gait belt ?Activity Tolerance: Patient limited by fatigue ?Patient left: in chair;with call bell/phone within reach;with chair alarm set ?Nurse Communication: Mobility status ?PT Visit Diagnosis: Unsteadiness on feet (R26.81);Muscle weakness (generalized) (M62.81) ?  ? ?Time: 4315-4008 ?PT Time Calculation (min) (ACUTE ONLY): 30 min ? ? ?Charges:   PT Evaluation ?$PT Eval Moderate Complexity: 1 Mod ?PT Treatments ?$Therapeutic Exercise: 8-22 mins ?  ?   ? ? ?Jiaire Rosebrook M,PT ?Acute Rehab Services ?[phone] ?[phone] (pager)  ? ?Glinda Natzke F Garlen Reinig ?08/27/2021, 3:43 PM ? ?

## 2021-08-27 NOTE — Progress Notes (Addendum)
FPTS Brief Progress Note ? ?S: Called to patient's room regarding patient fighting and shouting with nursing staff. Charge nurse and patient's nurse at bedside on arrival.  Dr. Berna Spare and I evaluated while patient was on commode and he is oriented only to self.  Patient believes he is in [place] and becomes more tangential throughout assessment. Patient's skin appears jaundiced.  Patient denies any acute pain.  Per nursing, patient patient is extremely unsteady with gait and trembling in extremities.  Patient's speech does not appear cleared slurred at this time and is relatively coherent with regards to the topics he wishes to discuss.  Dr. Berna Spare spoke with charge nurse and floor director to get patient one-to-one sitter at this time. ? ? ?O: ?BP 130/83 (BP Location: Right Arm)   Pulse (!) 113   Temp 98.1 ?F (36.7 ?C) (Oral)   Resp 18   Ht 5\' 9"  (1.753 m)   Wt 91.9 kg   SpO2 98%   BMI 29.92 kg/m?   ?Tachycardic in the 130s.  ? ?A/P: ? ?-On Librium taper. CIWA score was obtained just prior to 50 mg Librium and will have another dose of Librium at 2 PM.  Continue with Librium taper ?-One-to-one sitter order placed ? ?France Ravens, MD ?08/27/2021, 1:09 PM ?PGY-1, [facility] Night Resident  ?Please page [phone] with questions.  ? ?

## 2021-08-27 NOTE — Progress Notes (Signed)
FPTS Brief Progress Note ? ?S: Pt aware he is in hospital. Does not have questions at this time. ? ? ?O: ?BP 131/70 (BP Location: Right Arm)   Pulse (!) 121   Temp 97.9 ?F (36.6 ?C) (Oral)   Resp 20   Ht 5\' 9"  (1.753 m)   Wt 91.9 kg   SpO2 92%   BMI 29.92 kg/m?   ?General: A&O x3 (person place and time), able to follow commands appropriately,  ?CV: tachycardic, normal rhythm ?Pulm: expiratory wheezing ?Neuro: EOMI,  ?Psych: mildly anxious and agitated, without hallucinations ?Derm: minimal sweating, minimally tremulous ? ?A/P: ?Alcohol Withdrawal ?Pt appears improved compared to prior CIWA scoring of 20 and receiving 3mg  Ativan. Discussed CIWA scoring with RN. Pt is still having inappropriate speech while conversing occasionally but overall able to follow commands and answer questions. Did not appear distracted by hallucinations of any nature. He will need to be monitored closely tonight. ?-CIWA q4h, Ativan based on scoring ?-Rapid response and notify primary team if >20 ?-Continue librium taper ?- Orders reviewed. Labs for AM ordered, which was adjusted as needed.  ? ?Tobacco use ?Hx of 1 ppd with worsened physical exam compared to prior.  ?-Nicotine patch 14mg  daily ?-Incentive spirometry q2h while awake ?-albuterol inhaler q6h prn ? ? , DO ?08/27/2021, 8:24 PM ?PGY-1, [facility] Night Resident  ?Please page [phone] with questions.  ? ? ?

## 2021-08-27 NOTE — Progress Notes (Addendum)
Family Medicine Teaching Service ?Daily Progress Note ?Intern Pager: [phone] ? ?Patient name: Jamie Chavez Medical record number: [id number] ?Date of birth: [birth year] Age: 51 y.o. Gender: male ? ?Primary Care Provider: [name], DO ?Consultants: Gastroenterology ?Code Status: Full ? ?Pt Overview and Major Events to Date:  ?3/10- Admitted, GI consulted ? ?Assessment and Plan: ?Jamie Chavez is a 51 y.o. male presenting with dizziness, diminished appetite and GI discomfort, found to have alcoholic hepatitis and cirrhosis on laboratory and imaging studies, now with worsening thrombocytopenia.  PMH is significant for HTN, GERD, alcohol use, tobacco use, hx depression/anxiety. ? ?Abd discomfort likely 2/2 ETOH hepatitis; alcoholic liver cirrhosis with transaminits ?Macrocytic anemia with thrombocytopenia; worsening ?No RUQ tenderness or notable jaundice. Hyperammonemia noted on labs, started on lactulose yesterday. No obvious signs of active bleeding. Hgb 9.4, down from 11.6 with elevated MCV to 103. Iron studies show decreased TIBC, saturation ratio 106 and elevated ferritin to 4,075- non-specific and could be elevated 2/2 inflammation from hepatitis. Will not be able to perform any EGD/procedure given low platelets. Needs monitoring inpatient to ensure platelets don't go any lower.  ?- PM CBC w/ differential at 2pm ?- Transfusion threshold <8.0 Hgb ?- Regular diet ?- B12, folate labs to evaluate macrocytic anemia ?- D/c IVF ?- Na phos ?- Hemachromatosis DNA-PCR pending ?- GI following ?- IV Protonix 40mg  q12 hours ? ? ?ETOH use, concern for alcohol withdrawal ?Electrolyte abnormalities ?Overnight CIWA of 10, s/p1 mg Ativan.No signs of asterixis or encephalopathy. Mild hand tremors, moderate anxiety and sinus tachycardia. Speech clear, fluent, and he is alert this morning. CIWA reported to 12 this AM by RN, though patient denied any visual hallucinations while I was in the room. He was given 2mg  PO  Ativan.Phos 2.2, repleted with overnight. Repeat 2.2, will replete again. Mg 1.2, repleted with 4g IV ?- CIWA protocol q4h, to be re-checked 1hr after administration of Ativan ?- Lactulose TID ?- D/c'ed standing Ativan orders, RN to page if CIWA >10 ?- IV thiamine, folate, MVI ? ?AKI, resolved ?Resolved. Cr back to baseline this AM,  ?- AM CMP to monitor Cr ? ?Tobacco use ?Denies wanting nicotine patch. Having some expiratory wheezes, likely has component of COPD given longstanding history of tobacco use. ?- Nicotine patch if deisred ?- Albuterol neb q6h PRN ? ?FEN/GI: Regular ?PPx: SCD's, no pharmacologic VTE prophylaxis given high-bleeding risk ?Dispo:Home pending clinical improvement . Barriers include thrombocytopenia, continued inpatient workup.  ? ?Subjective:  ?Feeling okay this morning. Worried about Denies visual hallucinations. ? ?Objective: ?Temp:  [98 ?F (36.7 ?C)-98.6 ?F (37 ?C)] 98 ?F (36.7 ?C) (03/10 ) ?Pulse Rate:  [101-123] 118 (03/10 0855) ?Resp:  [11-23] 18 (03/10 0733) ?BP: (112-162)/(52-87) 134/64 (03/10 0855) ?SpO2:  [90 %-99 %] 93 % (03/10 0733) ?Weight:  [90.7 kg-91.9 kg] 91.9 kg (03/10 0230) ?Physical Exam: ?General: Alert, laying in bed in no distress. Gets sleepy at end of exam ?Cardiovascular: RRR, sinus tachycardia. No murmurs ?Respiratory: Expiratory wheezing, on room air with no increased work of breathing. No crackles. ?Abdomen: Soft, obese, distended, non-tender, normal bowel sounds ?Extremities: Warm, dry ? ?Laboratory: ?Recent Labs  ?Lab 08/26/21 ?1002 08/27/21 ?0636  ?WBC 6.0 3.8*  ?HGB 11.6* 9.4*  ?HCT 31.9* 25.9*  ?PLT 30* 24*  ? ?Recent Labs  ?Lab 08/26/21 ?1002 08/27/21 ?0636  ?NA 133* 133*  ?K 4.2 4.0  ?CL 97* 100  ?CO2 15* 21*  ?BUN 20 16  ?CREATININE 1.35* 0.85  ?CALCIUM 8.9 8.2*  ?  PROT 7.8 6.5  ?BILITOT 6.7* 8.2*  ?ALKPHOS 86 74  ?ALT 67* 62*  ?AST 174* 163*  ?GLUCOSE 148* 124*  ? ? ?Imaging/Diagnostic Tests: ?CT Head Wo Contrast ? ?Result Date:  08/26/2021 ?CLINICAL DATA:  Mental status change. EXAM: CT HEAD WITHOUT CONTRAST TECHNIQUE: Contiguous axial images were obtained from the base of the skull through the vertex without intravenous contrast. RADIATION DOSE REDUCTION: This exam was performed according to the departmental dose-optimization program which includes automated exposure control, adjustment of the mA and/or kV according to patient size and/or use of iterative reconstruction technique. COMPARISON:  None. FINDINGS: Brain: The ventricles are normal in size and configuration. No extra-axial fluid collections are identified. The gray-white differentiation is maintained. No CT findings for acute hemispheric infarction or intracranial hemorrhage. No mass lesions. The brainstem and cerebellum are normal. Vascular: No hyperdense vessels or obvious aneurysm. Skull: No acute skull fracture.  No bone lesion. Sinuses/Orbits: The paranasal sinuses and mastoid air cells are clear. The globes are intact. Other: No scalp lesions, laceration or hematoma. IMPRESSION: No acute intracranial findings or mass lesions. Electronically Signed   By: Rudie Meyer M.D.   On: 08/26/2021 11:05  ? ?US Abdomen Limited RUQ (LIVER/GB) ? ?Result Date: 08/26/2021 ?CLINICAL DATA:  Elevated liver function tests EXAM: ULTRASOUND ABDOMEN LIMITED RIGHT UPPER QUADRANT COMPARISON:  03/22/2010 FINDINGS: Gallbladder: No wall thickening or focal tenderness. No detected calculi, small volume sludge is possible. Common bile duct: Diameter: 5 mm Liver: Echogenic, heterogeneous, and poorly transmitting echoes. Nodular appearance of the surface is well. Portal vein is patent on color Doppler imaging with normal direction of blood flow towards the liver. IMPRESSION: 1. Cirrhotic appearance of the liver. 2. Cholelithiasis. Electronically Signed   By: Tiburcio Pea M.D.   On: 08/26/2021 12:01   ? ? ?Darral Dash, DO ?08/27/2021, 9:13 AM ?PGY-1, [facility] ?FPTS Intern pager:  [phone], text pages welcome ? ?

## 2021-08-27 NOTE — Hospital Course (Addendum)
Jamie Chavez is a 51 y.o.male with a history of HTN, GERD, alcohol and tobacco use, depression/anxiety who was admitted to the [facility] Teaching Service at [facility] for abdominal discomfort and new onset alcoholic hepatitis and cirrhosis. His hospital course is detailed below:  Alcoholic liver cirrhosis with transaminitis Patient presented to ED with dizziness, diminished appetite worsening for 2 weeks and abdominal discomfort.  Also notably has family history of hemochromatosis.  RUQ U/S showed cirrhotic appearance of liver with cholelithiasis.  CT head negative.  Bilirubin elevated to 6.1, T bili 6.7, AST/ALT 174/67.  Patient was admitted for monitoring for alcohol withdrawal and GI work-up.   Alcohol dependence   AMS Patient presented hemodynamically stable and alert and oriented.  Given significant alcohol use, CIWA protocol was put into place.  Ammonia level elevated to 60s, CCM consulted on the second day of admission as patient began having confusion, agitation, hallucinations.  Ultimately, he continued to be managed on the floor and placed on a Librium taper with as needed Ativan per CIWA protocol in addition to one-to-one observation. ***   Macrocytic anemia   thrombocytopenia Presented with hemoglobin 8.5 and FOBT positive without acute signs of bleeding but endorsing several weeks of dark-colored stools.  GI recommended EGD/colonoscopy to evaluate for varices and heme positive stool however this was delayed given platelets 34 upon presentation. This was suspect to be related to liver cirrhosis Hemoglobin platelets monitored closely during hospitalization with transfusion threshold of hemoglobin <7 and plts <10. ***    Other chronic conditions were medically managed with home medications and formulary alternatives as necessary (***)  PCP Follow-up Recommendations:  F/u hematology in 3-4 wks

## 2021-08-27 NOTE — Progress Notes (Addendum)
Date and time results received: 08/27/21 0746 ? ?Test: platelets ?Critical Value: 24  ? ?Name of Provider Notified: Gwendlyn Deutscher, MD - forwarded messages to teaching services at time notified. Gwendlyn Deutscher, MD viewed chat, Madison Hickman, DO responded.  ? ?Orders Received? Or Actions Taken?:  Waiting for response ? ?Medication orders received from [place], [place] ?

## 2021-08-28 DIAGNOSIS — G9349 Other encephalopathy: Secondary | ICD-10-CM | POA: Diagnosis not present

## 2021-08-28 DIAGNOSIS — K7010 Alcoholic hepatitis without ascites: Secondary | ICD-10-CM | POA: Diagnosis not present

## 2021-08-28 DIAGNOSIS — R42 Dizziness and giddiness: Secondary | ICD-10-CM | POA: Diagnosis not present

## 2021-08-28 DIAGNOSIS — K7030 Alcoholic cirrhosis of liver without ascites: Secondary | ICD-10-CM | POA: Diagnosis not present

## 2021-08-28 DIAGNOSIS — D696 Thrombocytopenia, unspecified: Secondary | ICD-10-CM

## 2021-08-28 DIAGNOSIS — N179 Acute kidney failure, unspecified: Secondary | ICD-10-CM | POA: Diagnosis not present

## 2021-08-28 LAB — CBC WITH DIFFERENTIAL/PLATELET
Abs Immature Granulocytes: 0.04 10*3/uL (ref 0.00–0.07)
Basophils Absolute: 0 10*3/uL (ref 0.0–0.1)
Basophils Relative: 1 %
Eosinophils Absolute: 0 10*3/uL (ref 0.0–0.5)
Eosinophils Relative: 1 %
HCT: 25.9 % — ABNORMAL LOW (ref 39.0–52.0)
Hemoglobin: 9 g/dL — ABNORMAL LOW (ref 13.0–17.0)
Immature Granulocytes: 1 %
Lymphocytes Relative: 31 %
Lymphs Abs: 1 10*3/uL (ref 0.7–4.0)
MCH: 37 pg — ABNORMAL HIGH (ref 26.0–34.0)
MCHC: 34.7 g/dL (ref 30.0–36.0)
MCV: 106.6 fL — ABNORMAL HIGH (ref 80.0–100.0)
Monocytes Absolute: 0.3 10*3/uL (ref 0.1–1.0)
Monocytes Relative: 11 %
Neutro Abs: 1.7 10*3/uL (ref 1.7–7.7)
Neutrophils Relative %: 55 %
Platelets: 23 10*3/uL — CL (ref 150–400)
RBC: 2.43 MIL/uL — ABNORMAL LOW (ref 4.22–5.81)
RDW: 15.6 % — ABNORMAL HIGH (ref 11.5–15.5)
WBC: 3.1 10*3/uL — ABNORMAL LOW (ref 4.0–10.5)
nRBC: 0 % (ref 0.0–0.2)

## 2021-08-28 LAB — FOLATE: Folate: 3.9 ng/mL — ABNORMAL LOW (ref >5.9)

## 2021-08-28 LAB — COMPREHENSIVE METABOLIC PANEL
ALT: 63 U/L — ABNORMAL HIGH (ref 0–44)
AST: 185 U/L — ABNORMAL HIGH (ref 15–41)
Albumin: 2.5 g/dL — ABNORMAL LOW (ref 3.5–5.0)
Alkaline Phosphatase: 69 U/L (ref 38–126)
Anion gap: 10 (ref 5–15)
BUN: 13 mg/dL (ref 6–20)
CO2: 23 mmol/L (ref 22–32)
Calcium: 8.5 mg/dL — ABNORMAL LOW (ref 8.9–10.3)
Chloride: 105 mmol/L (ref 98–111)
Creatinine, Ser: 0.81 mg/dL (ref 0.61–1.24)
GFR, Estimated: >60 mL/min (ref >60)
Glucose, Bld: 118 mg/dL — ABNORMAL HIGH (ref 70–99)
Potassium: 3.9 mmol/L (ref 3.5–5.1)
Sodium: 138 mmol/L (ref 135–145)
Total Bilirubin: 7.9 mg/dL — ABNORMAL HIGH (ref 0.3–1.2)
Total Protein: 6.4 g/dL — ABNORMAL LOW (ref 6.5–8.1)

## 2021-08-28 LAB — CBC
HCT: 29.5 % — ABNORMAL LOW (ref 39.0–52.0)
Hemoglobin: 10.7 g/dL — ABNORMAL LOW (ref 13.0–17.0)
MCH: 38.2 pg — ABNORMAL HIGH (ref 26.0–34.0)
MCHC: 36.3 g/dL — ABNORMAL HIGH (ref 30.0–36.0)
MCV: 105.4 fL — ABNORMAL HIGH (ref 80.0–100.0)
Platelets: 28 10*3/uL — CL (ref 150–400)
RBC: 2.8 MIL/uL — ABNORMAL LOW (ref 4.22–5.81)
RDW: 15.5 % (ref 11.5–15.5)
WBC: 3.7 10*3/uL — ABNORMAL LOW (ref 4.0–10.5)
nRBC: 0.5 % — ABNORMAL HIGH (ref 0.0–0.2)

## 2021-08-28 LAB — PHOSPHORUS: Phosphorus: 2.6 mg/dL (ref 2.5–4.6)

## 2021-08-28 LAB — VITAMIN B12: Vitamin B-12: 577 pg/mL (ref 180–914)

## 2021-08-28 LAB — MAGNESIUM: Magnesium: 1.8 mg/dL (ref 1.7–2.4)

## 2021-08-28 MED ORDER — RIFAXIMIN 550 MG PO TABS
550.0000 mg | ORAL_TABLET | Freq: Two times a day (BID) | ORAL | Status: DC
Start: 2021-08-28 — End: 2021-08-31
  Administered 2021-08-28 – 2021-08-31 (×6): 550 mg via ORAL
  Filled 2021-08-28 (×6): qty 1

## 2021-08-28 NOTE — Progress Notes (Signed)
? ?Progress Note ? ?Hospital Day: 3 ? ?Covering for Jamie Ada, MD ? ?Chief Complaint: cirrhosis ? ?Assessment and Plan  ? ?Brief History ?51 yo male with pmh of  ETOH abuse, GERD, cholelithiasis, HTN, cirrhosis by [place] this admission. Admitted with confusion, headache and lightheadedness. Accelerated etoh use over last several days.  Imaging suggests cirrhosis. Ammonia level elevated. Has AKI ? ?   ?#Etoh hepatitis ( Alcohol level 133) and cirrhosis by [place]. MELD 20. No evidence for portal HTN on [place] but admitted with altered mental status /elevated ammonia.  He is mildly coagulopathic, could be partly from nutritional deficients. Low platelets probably multifactorial ( cirrhosis, hypersplenism / bone marrow suppression.  No ascites.  ?--MDF 30 ( using PT reference of 13) so no need for steroids ?--Some improvement in bilirubin overnight. Tbili 7.9, AST 185, ALT 63, Alk phos 69 ?--Creatinine back to baseline 0.8.  ?- Coagulopathic with INR 1.5. Albumin low at 2.5.  ?--Platelets 25 ?--Cirrhosis probably etoh related.   HBV and HCV serologies negative. Will obtain additional labs to evaluate for other etiology of chronic liver disease. Labs for hemachromatosis are pending ( family history). Ferritin 4,075 in setting of Etoh ?--Needs outpatient HAV and HBV vaccines ?--Continue Lactulose. Titrate dose to 2-3 BMs / day. Judicious use of benzodiazepines  ?--Eventual EGD for evaluation of FOBT+ and varices screening. Patient tells me Dr. Benson [place] implied these would be done outpatient and he wants to go home ?--We discussed that it is imperative that he stopped etoh ? ? ?# Macrocytic anemia with history of Etoh abuse / low folate level. Normal B12. He is FOBT+.  ?--getting folate replacement ?--Eventual EGD / colonoscopy  ? ? ?Subjective  ? ?No complaints ? ? ?Objective  ? ? ?Imaging:  ? ?CT Head Wo Contrast ? ?Result Date: 08/26/2021 ?CLINICAL DATA:  Mental status change. EXAM: CT HEAD WITHOUT CONTRAST TECHNIQUE: Contiguous  axial images were obtained from the base of the skull through the vertex without intravenous contrast. RADIATION DOSE REDUCTION: This exam was performed according to the departmental dose-optimization program which includes automated exposure control, adjustment of the mA and/or kV according to patient size and/or use of iterative reconstruction technique. COMPARISON:  None. FINDINGS: Brain: The ventricles are normal in size and configuration. No extra-axial fluid collections are identified. The gray-white differentiation is maintained. No CT findings for acute hemispheric infarction or intracranial hemorrhage. No mass lesions. The brainstem and cerebellum are normal. Vascular: No hyperdense vessels or obvious aneurysm. Skull: No acute skull fracture.  No bone lesion. Sinuses/Orbits: The paranasal sinuses and mastoid air cells are clear. The globes are intact. Other: No scalp lesions, laceration or hematoma. IMPRESSION: No acute intracranial findings or mass lesions. Electronically Signed   By: Marijo Sanes M.D.   On: 08/26/2021 11:05  ? ?US Abdomen Limited RUQ (LIVER/GB) ? ?Result Date: 08/26/2021 ?CLINICAL DATA:  Elevated liver function tests EXAM: ULTRASOUND ABDOMEN LIMITED RIGHT UPPER QUADRANT COMPARISON:  03/22/2010 FINDINGS: Gallbladder: No wall thickening or focal tenderness. No detected calculi, small volume sludge is possible. Common bile duct: Diameter: 5 mm Liver: Echogenic, heterogeneous, and poorly transmitting echoes. Nodular appearance of the surface is well. Portal vein is patent on color Doppler imaging with normal direction of blood flow towards the liver. IMPRESSION: 1. Cirrhotic appearance of the liver. 2. Cholelithiasis. Electronically Signed   By: Jorje Guild M.D.   On: 08/26/2021 12:01   ? ? ?Lab Results: ?Recent Labs  ?  08/27/21 ?0636 08/27/21 ?1223 08/28/21 ?5681  ?  WBC 3.8* 3.9* 3.1*  ?HGB 9.4* 9.6* 9.0*  ?HCT 25.9* 26.7* 25.9*  ?PLT 24* 24* 23*  ? ?BMET ?Recent Labs  ?  08/27/21 ?0636  08/27/21 ?1223 08/28/21 ?0705  ?NA 133* 135 138  ?K 4.0 4.0 3.9  ?CL 100 103 105  ?CO2 21* 21* 23  ?GLUCOSE 124* 141* 118*  ?BUN $Rem'16 15 13  'NETy$ ?CREATININE 0.85 0.86 0.81  ?CALCIUM 8.2* 8.2* 8.5*  ? ?LFT ?Recent Labs  ?  08/28/21 ?0705  ?PROT 6.4*  ?ALBUMIN 2.5*  ?AST 185*  ?ALT 63*  ?ALKPHOS 69  ?BILITOT 7.9*  ? ?PT/INR ?Recent Labs  ?  08/26/21 ?1738  ?LABPROT 17.8*  ?INR 1.5*  ? ? ?Etoh level 133 ?Ammonia 62 ? ?Scheduled inpatient medications:  ? chlordiazePOXIDE  25 mg Oral TID  ? Followed by  ? [START ON 08/29/2021] chlordiazePOXIDE  25 mg Oral BH-qamhs  ? Followed by  ? [START ON 08/30/2021] chlordiazePOXIDE  25 mg Oral Daily  ? folic acid  1 mg Oral Daily  ? lactulose  30 g Oral TID  ? multivitamin with minerals  1 tablet Oral Daily  ? nicotine  14 mg Transdermal Daily  ? thiamine  100 mg Oral Daily  ? Or  ? thiamine  100 mg Intravenous Daily  ? ?Continuous inpatient infusions:  ? lactated ringers 75 mL/hr at 08/27/21 1146  ? ?PRN inpatient medications: albuterol, chlordiazePOXIDE, escitalopram, hydrOXYzine, loperamide, LORazepam **OR** LORazepam, ondansetron ? ?Vital signs in last 24 hours: ?Temp:  [97.9 ?F (36.6 ?C)-98.2 ?F (36.8 ?C)] 98.2 ?F (36.8 ?C) (03/11 0805) ?Pulse Rate:  [18-121] 18 (03/11 0805) ?Resp:  [17-20] 19 (03/11 0805) ?BP: (118-141)/(66-92) 141/92 (03/11 0805) ?SpO2:  [91 %-94 %] 94 % (03/11 0805) ?Last BM Date : 08/27/21 ? ?Intake/Output Summary (Last 24 hours) at 08/28/2021 1154 ?Last data filed at 08/28/2021 0615 ?Gross per 24 hour  ?Intake --  ?Output 400 ml  ?Net -400 ml  ? ? ? ?Physical Exam:  ?General: Alert male in NAD ?Heart:  Regular rate and rhythm. No lower extremity edema ?Pulmonary: Normal respiratory effort ?Abdomen: Soft, nondistended, nontender. Normal bowel sounds.  ?Neurologic: Alert and oriented. No aterixis ?Psych: Pleasant. Cooperative.  ? ? ?Intake/Output from previous day: ?03/10 0701 - 03/11 0700 ?In: -  ?Out: 400 [Urine:400] ?Intake/Output this shift: ?No intake/output data  recorded. ? ? ? ?Principal Problem: ?  Alcoholic hepatitis ?Active Problems: ?  Alcoholic cirrhosis, unspecified whether ascites present ([place]) ?  AKI (acute kidney injury) ([place]) ?  Dizziness ?  Heme positive stool ?  LFT elevation ?  Thrombocytopenia ([place]) ? ? ? ? LOS: 2 days  ? ?Tye Savoy ,NP 08/28/2021, 11:54 AM ? ? ? ? ? ? ?

## 2021-08-28 NOTE — Progress Notes (Signed)
Occupational Therapy Evaluation Patient Details Name: Jamie Chavez MRN: [id number] DOB: 29-Jan-1971 Today's Date: 08/28/2021   History of Present Illness Jamie Chavez is a 51 y.o. male presenting with dizziness, diminished appetite and GI discomfort, found to have thrombocytopenia, alcoholic hepatitis and cirrhosis on laboratory and imaging studies, now with worsening thrombocytopenia.  PMH is significant for HTN, GERD, alcohol use, tobacco use, hx depression/anxiety.   Clinical Impression   Pt presents with deficits listed above. PTA pt PLOF living at home alone, still working full time, managing ADLs, IADLs, and driving Independently. Pt presents with decreased strength, ADL function, safety awareness, decreased functional stability, and activity tolerance. During assessment, pt demonstrated CGA bed mobility, supervision seated LB dressing, CGA to minA simulated toilet transfer with RW. Vitals assessed (see below for details). Pt will benefit to continue skilled level OT to address established deficits prior to DC to ensure safety and to maximize independence. DC recommendation to HHOT. OT will continue to follow acutely.      Recommendations for follow up therapy are one component of a multi-disciplinary discharge planning process, led by the attending physician.  Recommendations may be updated based on patient status, additional functional criteria and insurance authorization.   Follow Up Recommendations  Home health OT    Assistance Recommended at Discharge Set up Supervision/Assistance  Patient can return home with the following      Functional Status Assessment  Patient has had a recent decline in their functional status and demonstrates the ability to make significant improvements in function in a reasonable and predictable amount of time.  Equipment Recommendations   (defer to post acute setting.)    Recommendations for Other Services       Precautions / Restrictions  Precautions Precautions: Fall Restrictions Weight Bearing Restrictions: No      Mobility Bed Mobility Overal bed mobility: Needs Assistance Bed Mobility: Supine to Sit     Supine to sit: Min guard     General bed mobility comments: decreased physical assistance this session.    Transfers Overall transfer level: Needs assistance Equipment used: 2 person hand held assist, Rolling walker (2 wheels) Transfers: Sit to/from Stand, Bed to chair/wheelchair/BSC Sit to Stand: Min assist, From elevated surface           General transfer comment: decreased c/o dizziness this session. Able to tolerate standing and agreeable to transfer to recliner.      Balance Overall balance assessment: Needs assistance Sitting-balance support: No upper extremity supported, Feet supported Sitting balance-Leahy Scale: Fair     Standing balance support: Bilateral upper extremity supported, During functional activity Standing balance-Leahy Scale: Poor Standing balance comment: relied on UE support                           ADL either performed or assessed with clinical judgement   ADL Overall ADL's : Needs assistance/impaired   Eating/Feeding Details (indicate cue type and reason): NT but will infer independent with set up in sitting.   Grooming Details (indicate cue type and reason): NT but will infer independent with set up in sitting.         Upper Body Dressing : Modified independent   Lower Body Dressing: Supervision/safety Lower Body Dressing Details (indicate cue type and reason): Pt able to demo removal of B socks with figure 4 postition. Toilet Transfer: Minimal Dentist Details (indicate cue type and reason): simulated toilet transfer from bed to recliner with RW  due to instability with HHA +1 at EOB.   Toileting - Clothing Manipulation Details (indicate cue type and reason): NT but will infer CGA to min A for safety in standing with toileting and  clothing management.     Functional mobility during ADLs: Min guard;Minimal assistance;Rolling walker (2 wheels) General ADL Comments: Pt required for cues for safe hand placement and sequencing with RW.     Vision Baseline Vision/History: 1 Wears glasses (readers)       Perception     Praxis      Pertinent Vitals/Pain       Hand Dominance Right   Extremity/Trunk Assessment Upper Extremity Assessment Upper Extremity Assessment: Generalized weakness (resting tremors observed with B hands.)   Lower Extremity Assessment Lower Extremity Assessment: Defer to PT evaluation   Cervical / Trunk Assessment Cervical / Trunk Assessment: Normal   Communication Communication Communication: No difficulties;Other (comment) (slurs speech at tijmes)   Cognition Arousal/Alertness: Awake/alert Behavior During Therapy: WFL for tasks assessed/performed Overall Cognitive Status: Within Functional Limits for tasks assessed                                 General Comments: SBT administered due to limitation observed with following directions and safety awareness, pt scored 3/28 which is significant for normal cognition.     General Comments  124 bpm, RA 95% SpO2, 131/96  supine, 156/88 sitting, 130/79 standing, 140/76 sitting.    Exercises     Shoulder Instructions      Home Living Family/patient expects to be discharged to:: Private residence Living Arrangements: Alone Available Help at Discharge: Family;Available PRN/intermittently (mom checks onh pt but she works) Type of Home: House Home Access: Stairs to enter Secretary/administrator of Steps: 7 Entrance Stairs-Rails: Left Home Layout: Able to live on main level with bedroom/bathroom     Bathroom Shower/Tub: Producer, television/film/video: Handicapped height     Home Equipment: Shower seat;Cane - single point;Rolling Walker (2 wheels);Grab bars - toilet;Grab bars - tub/shower;Hand held shower  head;BSC/3in1   Additional Comments: 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      Prior Functioning/Environment Prior Level of Function : Independent/Modified Independent;Driving;Working/employed             Mobility Comments: was able to walk around ADLs Comments: bathe and dress self        OT Problem List: Decreased strength;Decreased range of motion;Decreased safety awareness;Impaired balance (sitting and/or standing);Decreased knowledge of use of DME or AE      OT Treatment/Interventions: Self-care/ADL training;Therapeutic exercise;DME and/or AE instruction;Therapeutic activities;Patient/family education;Balance training    OT Goals(Current goals can be found in the care plan section) Acute Rehab OT Goals Patient Stated Goal:  (To go home) OT Goal Formulation: With patient Time For Goal Achievement: 09/11/21 Potential to Achieve Goals: Fair  OT Frequency: Min 2X/week    Co-evaluation              AM-PAC OT "6 Clicks" Daily Activity     Outcome Measure Help from another person eating meals?: None Help from another person taking care of personal grooming?: A Little Help from another person toileting, which includes using toliet, bedpan, or urinal?: A Little Help from another person bathing (including washing, rinsing, drying)?: A Little Help from another person to put on and taking off regular upper body clothing?: A Little Help from another person to put on and taking off regular lower  body clothing?: A Little 6 Click Score: 19   End of Session Equipment Utilized During Treatment: Gait belt;Rolling walker (2 wheels) Nurse Communication: Mobility status  Activity Tolerance: Patient tolerated treatment well Patient left: in chair;with call bell/phone within reach;with chair alarm set  OT Visit Diagnosis: Unsteadiness on feet (R26.81);Muscle weakness (generalized) (M62.81)                Time: [id number] OT Time Calculation (min): 26 min Charges:  OT General  Charges $OT Visit: 1 Visit OT Evaluation $OT Eval Moderate Complexity: 1 Mod OT Treatments $Self Care/Home Management : 8-22 mins  [place] OldEvan Erin Obando, MSOT, OTR/L  [facility]  [phone]   Jamie Chavez 08/28/2021, 10:37 AM

## 2021-08-28 NOTE — Progress Notes (Addendum)
Family Medicine Teaching Service ?Daily Progress Note ?Intern Pager: [phone] ? ?Patient name: Jamie Chavez Medical record number: [id number] ?Date of birth: 1971-03-10 Age: 51 y.o. Gender: male ? ?Primary Care Provider: [name], DO ?Consultants: Gastroenterology ?Code Status: Full ? ?Pt Overview and Major Events to Date:  ?3/10- Admitted, GI consulted ? ?Assessment and Plan: ?Jamie Chavez is a 51 y.o. male presenting with dizziness, diminished appetite and GI discomfort, found to have alcoholic hepatitis and cirrhosis on laboratory and imaging studies, now with worsening thrombocytopenia.  PMH is significant for HTN, GERD, alcohol use, tobacco use, hx depression/anxiety. ? ?Abd discomfort likely 2/2 ETOH hepatitis; alcoholic liver cirrhosis with transaminits ?Macrocytic anemia with thrombocytopenia; stable ?Patient's abdominal pain is improved today with no tenderness.  Yesterday he showed worsening signs of alcohol withdrawal and ammonia level was rechecked which had increased to 75.  No active signs of bleeding today and hemoglobin stable at 9.6 from 9.4 yesterday.  LFTs stable at AST/ALT 185/63.  T bilirubin downtrending to 7.9 from 9.0 at last check.  Folate labs came back with decreased folate at 3.9.  GI has been consulted who will not be able to do any EGD procedure due to his low platelets. ?- Continue to monitor for signs of bleeding ?- Transfusion threshold for hemoglobin less than 8 ?- Platelet transfusion threshold less than 10,000 or active signs of bleeding ?- Folate supplementation ?- Hemochromatosis DNA PCR pending ?- GI following, appreciate their assistance ?- Discontinue IV Protonix 40 mg every 12 hours ? ?ETOH use, concern for alcohol withdrawal ?Electrolyte abnormalities ?Patient had elevated CIWA's early in the night up to 20.  Received scheduled Librium as well as 6 mg of Ativan overnight.  This morning patient is greatly improved.  He is alert and oriented x3.  Patient has notable  scleral icterus as well as tremor.  Denies any hallucinations and responds to questions appropriately.  Magnesium is 1.8, phosphorus 2.6. ?- Continue CIWA protocol every 4 hours with recheck 1 hour after administration of Ativan if needed ?- Continue lactulose 3 times daily ?- Continue Librium ?- Continue folate repletion ? ?AKI ?Resolved.  Creatinine of 0.81 today ? ?Tobacco use ?Patient originally denied nicotine patch but has requested 1 at this time.  Notable for occasional expiratory wheezes on exam today.  Most likely chronic. ?- Nicotine patch prescribed ?- Albuterol nebs every 6 as needed ? ?FEN/GI: Regular ?PPx: SCD's, no pharmacologic VTE prophylaxis given high-bleeding risk ?Dispo:Home pending clinical improvement . Barriers include thrombocytopenia, continued inpatient workup.  ? ?Subjective:  ?Patient reports he is doing "much better" today.  He reports he felt "weird" yesterday but does not remember much of what occurred yesterday.  We discussed his hallucinations but he does not remember any of this.  Denies any hallucinations today.  Discussed his want for alcohol cessation and that is his main goal at this time. ? ?Objective: ?Temp:  [97.9 ?F (36.6 ?C)-98.1 ?F (36.7 ?C)] 97.9 ?F (36.6 ?C) (03/10 1555) ?Pulse Rate:  [109-121] 109 (03/11 0334) ?Resp:  [17-20] 18 (03/11 0334) ?BP: (118-134)/(64-83) 124/74 (03/11 0334) ?SpO2:  [91 %-98 %] 91 % (03/11 0334) ?Physical Exam: ?General: Pleasant, well-appearing 51 year old male ?Cardiac: Tachycardic in the low 100s ?Respiratory: Normal work of breathing, occasional expiratory wheezes ?Abdomen: Soft, nontender, positive bowel sounds, hepatomegaly present ?Extremities: Bruising on right upper extremity, no other bruising appreciated ? ?Laboratory: ?Recent Labs  ?Lab 08/26/21 ?1002 08/27/21 ?0636 08/27/21 ?1223  ?WBC 6.0 3.8* 3.9*  ?HGB 11.6* 9.4*  9.6*  ?HCT 31.9* 25.9* 26.7*  ?PLT 30* 24* 24*  ? ? ?Recent Labs  ?Lab 08/26/21 ?1002 08/27/21 ?0636 08/27/21 ?1223   ?NA 133* 133* 135  ?K 4.2 4.0 4.0  ?CL 97* 100 103  ?CO2 15* 21* 21*  ?BUN 20 16 15   ?CREATININE 1.35* 0.85 0.86  ?CALCIUM 8.9 8.2* 8.2*  ?PROT 7.8 6.5 7.0  ?BILITOT 6.7* 8.2* 9.0*  ?ALKPHOS 86 74 72  ?ALT 67* 62* 67*  ?AST 174* 163* 186*  ?GLUCOSE 148* 124* 141*  ? ? ? ?Imaging/Diagnostic Tests: ?No results found. ? ? ? , MD ?08/28/2021, 7:11 AM ?PGY-3, [facility] ?FPTS Intern pager: [phone], text pages welcome ? ?

## 2021-08-28 NOTE — Progress Notes (Signed)
Date and time results received: 08/28/21 0747 ? ?Test: platelet ?Critical Value: 23 ? ?Name of Provider Notified: Dahbura, DO ? ?Orders Received? Or Actions Taken?:  Provider aware ?

## 2021-08-29 DIAGNOSIS — D539 Nutritional anemia, unspecified: Secondary | ICD-10-CM

## 2021-08-29 DIAGNOSIS — D696 Thrombocytopenia, unspecified: Secondary | ICD-10-CM | POA: Diagnosis not present

## 2021-08-29 DIAGNOSIS — K7030 Alcoholic cirrhosis of liver without ascites: Secondary | ICD-10-CM

## 2021-08-29 DIAGNOSIS — N179 Acute kidney failure, unspecified: Secondary | ICD-10-CM | POA: Diagnosis not present

## 2021-08-29 DIAGNOSIS — K7010 Alcoholic hepatitis without ascites: Secondary | ICD-10-CM | POA: Diagnosis not present

## 2021-08-29 DIAGNOSIS — R195 Other fecal abnormalities: Secondary | ICD-10-CM | POA: Diagnosis not present

## 2021-08-29 DIAGNOSIS — G9349 Other encephalopathy: Secondary | ICD-10-CM | POA: Diagnosis not present

## 2021-08-29 LAB — CBC
HCT: 26 % — ABNORMAL LOW (ref 39.0–52.0)
Hemoglobin: 8.9 g/dL — ABNORMAL LOW (ref 13.0–17.0)
MCH: 36.6 pg — ABNORMAL HIGH (ref 26.0–34.0)
MCHC: 34.2 g/dL (ref 30.0–36.0)
MCV: 107 fL — ABNORMAL HIGH (ref 80.0–100.0)
Platelets: 28 10*3/uL — CL (ref 150–400)
RBC: 2.43 MIL/uL — ABNORMAL LOW (ref 4.22–5.81)
RDW: 15.7 % — ABNORMAL HIGH (ref 11.5–15.5)
WBC: 3.5 10*3/uL — ABNORMAL LOW (ref 4.0–10.5)
nRBC: 0 % (ref 0.0–0.2)

## 2021-08-29 LAB — COMPREHENSIVE METABOLIC PANEL
ALT: 69 U/L — ABNORMAL HIGH (ref 0–44)
AST: 178 U/L — ABNORMAL HIGH (ref 15–41)
Albumin: 2.4 g/dL — ABNORMAL LOW (ref 3.5–5.0)
Alkaline Phosphatase: 69 U/L (ref 38–126)
Anion gap: 9 (ref 5–15)
BUN: 13 mg/dL (ref 6–20)
CO2: 22 mmol/L (ref 22–32)
Calcium: 8.4 mg/dL — ABNORMAL LOW (ref 8.9–10.3)
Chloride: 103 mmol/L (ref 98–111)
Creatinine, Ser: 0.65 mg/dL (ref 0.61–1.24)
GFR, Estimated: >60 mL/min (ref >60)
Glucose, Bld: 110 mg/dL — ABNORMAL HIGH (ref 70–99)
Potassium: 3.5 mmol/L (ref 3.5–5.1)
Sodium: 134 mmol/L — ABNORMAL LOW (ref 135–145)
Total Bilirubin: 7.3 mg/dL — ABNORMAL HIGH (ref 0.3–1.2)
Total Protein: 5.9 g/dL — ABNORMAL LOW (ref 6.5–8.1)

## 2021-08-29 LAB — ALPHA-1-ANTITRYPSIN: A-1 Antitrypsin, Ser: 190 mg/dL — ABNORMAL HIGH (ref 101–187)

## 2021-08-29 LAB — CERULOPLASMIN: Ceruloplasmin: 21.7 mg/dL (ref 16.0–31.0)

## 2021-08-29 LAB — ANTI-SMOOTH MUSCLE ANTIBODY, IGG: F-Actin IgG: 16 Units (ref 0–19)

## 2021-08-29 LAB — AFP TUMOR MARKER: AFP, Serum, Tumor Marker: 6.5 ng/mL (ref 0.0–6.9)

## 2021-08-29 LAB — MITOCHONDRIAL ANTIBODIES: Mitochondrial M2 Ab, IgG: <20 Units (ref 0.0–20.0)

## 2021-08-29 MED ORDER — BISMUTH SUBSALICYLATE 262 MG PO CHEW
524.0000 mg | CHEWABLE_TABLET | ORAL | Status: DC | PRN
  Filled 2021-08-29: qty 2

## 2021-08-29 NOTE — Progress Notes (Signed)
FPTS Interim Night Progress Note ? ?S:Patient sleeping comfortably.  Rounded with primary night RN.  Reports CIWA scores remain 0.  No signs of bleeding. No concerns voiced.  No orders required.   ? ?O: ?Today's Vitals  ? 08/29/21 1303 08/29/21 1646 08/29/21 1915 08/29/21 2105  ?BP: 122/72 126/77  (!) 148/75  ?Pulse: (!) 104 99  74  ?Resp: 20 20  20   ?Temp: 98.1 ?F (36.7 ?C) 98.5 ?F (36.9 ?C)  98.5 ?F (36.9 ?C)  ?TempSrc: Oral Oral  Oral  ?SpO2: 97% 96%  97%  ?Weight:      ?Height:      ?PainSc:   0-No pain   ? ? ? ? ?A/P: ?Continue current management ? ?Carollee Leitz MD ?PGY-3, [facility] ?Service pager [phone]   ?

## 2021-08-29 NOTE — Consult Note (Addendum)
[place]  [phone]   HEMATOLOGY ONCOLOGY INPATIENT CONSULTATION   Jamie Chavez  DOB: 04-May-1971  MR#: [id number]  CSN#: [id number]    Requesting Physician:  [facility] IM resident   Patient Care Team: Coral Spikes, DO as PCP - General (Family Medicine)  Reason for consult: pancytopenia   History of present illness:   51 yo male with PMH of hypertension, hemochromatosis, GERD, alcohol and tobacco abuse, presented with dizziness, low appetite and abdominal discomfort.  His work-up reviewed liver cirrhosis, transaminitis and hyperbilirubinemia.  CBC on admission showed normal WBC, hemoglobin 11.6, platelets 30.  His platelet was 93 a year ago.  He denies any significant bleeding.  He has family history of hemochromatosis in his sister.  Repeated CBC showed hemoglobin 23.2 28K, Hg 10-11. No over GI bleeding. Pt was seen by GI Dr. Modena Nunnery. He has been treated for alcohol withdrawal, and supportive care. He is feeling better.   MEDICAL HISTORY:  Past Medical History:  Diagnosis Date   ETOH abuse    Drinks 23-4 beers + 5-6 shots of liquor per day   GERD (gastroesophageal reflux disease)    Hypertension     SURGICAL HISTORY: Past Surgical History:  Procedure Laterality Date   ARTHROSCOPIC REPAIR ACL     SHOULDER ARTHROSCOPY      SOCIAL HISTORY: Social History   Socioeconomic History   Marital status: Married    Spouse name: Not on file   Number of children: Not on file   Years of education: Not on file   Highest education level: Not on file  Occupational History   Not on file  Tobacco Use   Smoking status: Every Day    Packs/day: 1.00    Types: Cigarettes   Smokeless tobacco: Never  Vaping Use   Vaping Use: Never used  Substance and Sexual Activity   Alcohol use: Yes    Alcohol/week: 21.0 standard drinks    Types: 21 Cans of beer per week    Comment: Drinks 3-4 beers per day   Drug use: Never   Sexual activity: Not on file  Other Topics  Concern   Not on file  Social History Narrative   Not on file   Social Determinants of Health   Financial Resource Strain: Not on file  Food Insecurity: Not on file  Transportation Needs: Not on file  Physical Activity: Not on file  Stress: Not on file  Social Connections: Not on file  Intimate Partner Violence: Not on file    FAMILY HISTORY: Family History  Problem Relation Age of Onset   Liver disease Sister     ALLERGIES:  has No Known Allergies.  MEDICATIONS:  Current Facility-Administered Medications  Medication Dose Route Frequency Provider Last Rate Last Admin   albuterol (PROVENTIL) (2.5 MG/3ML) 0.083% nebulizer solution 2.5 mg  2.5 mg Nebulization Q6H PRN Dameron, Marisa, DO       bismuth subsalicylate (PEPTO BISMOL) chewable tablet 524 mg  524 mg Oral PRN Paige, Victoria J, DO       chlordiazePOXIDE (LIBRIUM) capsule 25 mg  25 mg Oral Q6H PRN Ollis, Brandi L, NP       chlordiazePOXIDE (LIBRIUM) capsule 25 mg  25 mg Oral BH-qamhs Ollis, Brandi L, NP   25 mg at 08/29/21 2132   Followed by   Derrill Memo ON 08/30/2021] chlordiazePOXIDE (LIBRIUM) capsule 25 mg  25 mg Oral Daily Ollis, Brandi L, NP       escitalopram (LEXAPRO)  tablet 10 mg  10 mg Oral Daily PRN Orvis Brill, DO   10 mg at 21/19/41 7408   folic acid (FOLVITE) tablet 1 mg  1 mg Oral Daily Dameron, Marisa, DO   1 mg at 08/29/21 0900   hydrOXYzine (ATARAX) tablet 25 mg  25 mg Oral Q6H PRN Noe Gens L, NP       lactated ringers infusion   Intravenous Continuous Donita Brooks, NP 75 mL/hr at 08/29/21 1445 New Bag at 08/29/21 1445   lactulose (CHRONULAC) 10 GM/15ML solution 30 g  30 g Oral TID Carol Ada, MD   30 g at 08/29/21 2132   loperamide (IMODIUM) capsule 2-4 mg  2-4 mg Oral PRN Noe Gens L, NP       LORazepam (ATIVAN) tablet 1-4 mg  1-4 mg Oral Q1H PRN Donita Brooks, NP       Or   LORazepam (ATIVAN) injection 1-4 mg  1-4 mg Intravenous Q1H PRN Noe Gens L, NP   3 mg at 08/27/21 2257    multivitamin with minerals tablet 1 tablet  1 tablet Oral Daily Dameron, Marisa, DO   1 tablet at 08/29/21 0900   nicotine (NICODERM CQ - dosed in mg/24 hours) patch 14 mg  14 mg Transdermal Daily Wells Guiles, DO   14 mg at 08/29/21 0903   ondansetron (ZOFRAN-ODT) disintegrating tablet 4 mg  4 mg Oral Q6H PRN Donita Brooks, NP       rifaximin Doreene Nest) tablet 550 mg  550 mg Oral BID Thornton Park, MD   550 mg at 08/29/21 2132   thiamine tablet 100 mg  100 mg Oral Daily Dameron, Marisa, DO   100 mg at 08/29/21 0900   Or   thiamine (B-1) injection 100 mg  100 mg Intravenous Daily Dameron, Luna Fuse, DO   100 mg at 08/26/21 1733    REVIEW OF SYSTEMS:   Constitutional: Denies fevers, chills or abnormal night sweats Eyes: Denies blurriness of vision, double vision or watery eyes Ears, nose, mouth, throat, and face: Denies mucositis or sore throat Respiratory: Denies cough, dyspnea or wheezes Cardiovascular: Denies palpitation, chest discomfort or lower extremity swelling Gastrointestinal:  see hPI  Skin: Denies abnormal skin rashes Lymphatics: Denies new lymphadenopathy or easy bruising Neurological:Denies numbness, tingling or new weaknesses Behavioral/Psych: Mood is stable, no new changes  All other systems were reviewed with the patient and are negative.  PHYSICAL EXAMINATION:  Vitals:   08/29/21 1646 08/29/21 2105  BP: 126/77 (!) 148/75  Pulse: 99 74  Resp: 20 20  Temp: 98.5 F (36.9 C) 98.5 F (36.9 C)  SpO2: 96% 97%   Filed Weights   08/26/21 1039 08/27/21 0230  Weight: 200 lb (90.7 kg) 202 lb 9.6 oz (91.9 kg)    GENERAL:alert, no distress and comfortable SKIN: skin color, texture, turgor are normal, no ecchymosis or petechia, no rashes or significant lesions except jaundice EYES: normal, conjunctiva are pink and non-injected, sclera clear OROPHARYNX:no exudate, no erythema and lips, buccal mucosa, and tongue normal  NECK: supple, thyroid normal size, non-tender,  without nodularity LYMPH:  no palpable lymphadenopathy in the cervical, axillary or inguinal LUNGS: clear to auscultation and percussion with normal breathing effort HEART: regular rate & rhythm and no murmurs and no lower extremity edema ABDOMEN:abdomen soft, non-tender and normal bowel sounds, mild splenmagely Musculoskeletal:no cyanosis of digits and no clubbing  PSYCH: alert & oriented x 3 with fluent speech NEURO: no focal motor/sensory deficits  LABORATORY DATA:  I  have reviewed the data as listed Lab Results  Component Value Date   WBC 3.5 (L) 08/29/2021   HGB 8.9 (L) 08/29/2021   HCT 26.0 (L) 08/29/2021   MCV 107.0 (H) 08/29/2021   PLT 28 (LL) 08/29/2021   Recent Labs    08/27/21 1223 08/28/21 0705 08/29/21 0554  NA 135 138 134*  K 4.0 3.9 3.5  CL 103 105 103  CO2 21* 23 22  GLUCOSE 141* 118* 110*  BUN '15 13 13  ' CREATININE 0.86 0.81 0.65  CALCIUM 8.2* 8.5* 8.4*  GFRNONAA >60 >60 >60  PROT 7.0 6.4* 5.9*  ALBUMIN 2.8* 2.5* 2.4*  AST 186* 185* 178*  ALT 67* 63* 69*  ALKPHOS 72 69 69  BILITOT 9.0* 7.9* 7.3*    RADIOGRAPHIC STUDIES: I have personally reviewed the radiological images as listed and agreed with the findings in the report. CT Head Wo Contrast  Result Date: 08/26/2021 CLINICAL DATA:  Mental status change. EXAM: CT HEAD WITHOUT CONTRAST TECHNIQUE: Contiguous axial images were obtained from the base of the skull through the vertex without intravenous contrast. RADIATION DOSE REDUCTION: This exam was performed according to the departmental dose-optimization program which includes automated exposure control, adjustment of the mA and/or kV according to patient size and/or use of iterative reconstruction technique. COMPARISON:  None. FINDINGS: Brain: The ventricles are normal in size and configuration. No extra-axial fluid collections are identified. The gray-white differentiation is maintained. No CT findings for acute hemispheric infarction or intracranial  hemorrhage. No mass lesions. The brainstem and cerebellum are normal. Vascular: No hyperdense vessels or obvious aneurysm. Skull: No acute skull fracture.  No bone lesion. Sinuses/Orbits: The paranasal sinuses and mastoid air cells are clear. The globes are intact. Other: No scalp lesions, laceration or hematoma. IMPRESSION: No acute intracranial findings or mass lesions. Electronically Signed   By: Marijo Sanes M.D.   On: 08/26/2021 11:05   US Abdomen Limited RUQ (LIVER/GB)  Result Date: 08/26/2021 CLINICAL DATA:  Elevated liver function tests EXAM: ULTRASOUND ABDOMEN LIMITED RIGHT UPPER QUADRANT COMPARISON:  03/22/2010 FINDINGS: Gallbladder: No wall thickening or focal tenderness. No detected calculi, small volume sludge is possible. Common bile duct: Diameter: 5 mm Liver: Echogenic, heterogeneous, and poorly transmitting echoes. Nodular appearance of the surface is well. Portal vein is patent on color Doppler imaging with normal direction of blood flow towards the liver. IMPRESSION: 1. Cirrhotic appearance of the liver. 2. Cholelithiasis. Electronically Signed   By: Jorje Guild M.D.   On: 08/26/2021 12:01    ASSESSMENT & PLAN:  51 yo male   Newly diagnosed liver cirrhosis, presumably alcohol related, rule out hemochromatosis Alcohol Hepatitis Metabolic encephalopathy and alcohol withdrawal, resolved Macrocytic anemia and thrombocytopenia, likely secondary to liver cirrhosis and alcohol, folate deficiency   Recommendations: -His worsening thrombocytopenia is likely related to his liver cirrhosis, possible splenmagely (not checked by image yet) and acute alcohol toxicity.  He has no bleeding, no need platelet transfusion at this point.  If platelets drop below 20, may consider TPO, consider plt transfusion if plt<15K -I will check immature platelet fraction  -He has family history of hemochromatosis, ferritin was 547 5 years ago, now above 4000.  This could be acute reactive change, but  hemochromatosis needs to be ruled out.  Genetic test was ordered and result is still pending.  We will repeat ferritin in a few months, may consider liver MRI, to see if any evidence of iron overload in liver.  Due to his anemia, he may  not be able to tolerate phlebotomy, if he has evidence of iron overload in liver, will consider iron chelating in future  -folate deficiency can also cause anemia and thrombocytopenia, this has been replaced. -I will check ret count and haptoglobin also to rule out hemolysis although my suspicion is low -I have reviewed his peripheral blood smear today, which showed polychromasia, target cells, and thrombocytopenia, no giant platelet, no schistocytes. -I will f/u as needed in hospital, and will schedule his f/u with me in clinic in 3-4 weeks    All questions were answered. The patient knows to call the clinic with any problems, questions or concerns.      Truitt Merle, MD 08/29/2021

## 2021-08-29 NOTE — Progress Notes (Signed)
? ? ? ?   Gastroenterology Progress Note ? ?CC:  I'm feeling better today ? ?Assessment / Plan: ?Admitted with alcoholic hepatitis. Suspected underlying cirrhosis based on imaging. He may have associated portal hypertension although platelets are lower than would be expected and ultrasound did not include evaluation beyond RUQ. Must consider concurrent etiologies of profound thrombocytopenia as it seems out of proportion to his pancytopenia. Hematology consultation recommended.  ?  ?Etiology of liver disease likely alcohol.  However, genetic testing for hemochromatosis is also underway given the marked elevation in ferritin. Recommend cross-sectional abdominal imaging as the only available imaging is a limited RUQ ultrasound. MRI would be preferred and they may be able to perform a hemachromatosis protocol to aid in the diagnosis.  ?  ?Encephalopathy has improved. Although there is no asterixis or clonus, he has mild euphoria that wasn't present yesterday. Continue lactulose and Xifaxan. Avoid benzos and narcotics after CIWA protocol is complete.  ?  ?Recommend vaccinations for HAV and HBV as well as screening for esophageal varices. At some point, he will need an EGD. Deferred at this time due to the extent of thrombocytopenia.  ? ?I have been covering this weekend for Dr. Elnoria Howard who will return to round 08/30/21.  ? ?Subjective: ?No GI complaints today. Asking for resources to help maintain sobriety on discharge. ? ?No family present at the time of my evaluation.  ? ?Objective:  ?Vital signs in last 24 hours: ?Temp:  [97.9 ?F (36.6 ?C)-98.2 ?F (36.8 ?C)] 98.1 ?F (36.7 ?C) (03/12 0820) ?Pulse Rate:  [107-124] 108 (03/12 0820) ?Resp:  [19-22] 20 (03/12 0820) ?BP: (115-134)/(71-86) 128/83 (03/12 0820) ?SpO2:  [95 %-98 %] 96 % (03/12 0820) ?Last BM Date : 08/28/21 ?General:   Alert, in NAD, slightly euphoric today ?Heart:  Regular rate and rhythm; no murmurs ?Pulm: Clear anteriorly; no wheezing ?Abdomen:  Soft.  Nontender. Nondistended. Normal bowel sounds. No rebound or guarding. ?Extremities:  Without edema. ?Neurologic:  Alert and  oriented x4;  no asterixis. Additional and subtraction by 7s has improved.  ?Psych:  Alert and cooperative. Normal mood and affect. ? ? ?Lab Results: ?Recent Labs  ?  08/28/21 ?0705 08/28/21 ?1238 08/29/21 ?3154  ?WBC 3.1* 3.7* 3.5*  ?HGB 9.0* 10.7* 8.9*  ?HCT 25.9* 29.5* 26.0*  ?PLT 23* 28* 28*  ? ?BMET ?Recent Labs  ?  08/27/21 ?1223 08/28/21 ?0705 08/29/21 ?0086  ?NA 135 138 134*  ?K 4.0 3.9 3.5  ?CL 103 105 103  ?CO2 21* 23 22  ?GLUCOSE 141* 118* 110*  ?BUN 15 13 13   ?CREATININE 0.86 0.81 0.65  ?CALCIUM 8.2* 8.5* 8.4*  ? ?LFT ?Recent Labs  ?  08/29/21 ?10/29/21  ?PROT 5.9*  ?ALBUMIN 2.4*  ?AST 178*  ?ALT 69*  ?ALKPHOS 69  ?BILITOT 7.3*  ? ?PT/INR ?Recent Labs  ?  08/26/21 ?1738  ?LABPROT 17.8*  ?INR 1.5*  ? ?Hepatitis Panel ?Recent Labs  ?  08/26/21 ?1738  ?HEPBSAG NON REACTIVE  ?HCVAB NON REACTIVE  ?HEPAIGM NON REACTIVE  ?HEPBIGM NON REACTIVE  ? ? ? ? LOS: 3 days  ? ?10/26/21  08/29/2021, 9:24 AM ? ?  ?

## 2021-08-29 NOTE — Progress Notes (Signed)
FPTS Interim Progress Note ? ?S:Patient sleeping and resting comfortably.   ? ?O: ?BP 115/71 (BP Location: Right Arm)   Pulse (!) 107   Temp 98.2 ?F (36.8 ?C) (Oral)   Resp (!) 22   Ht 5\' 9"  (1.753 m)   Wt 91.9 kg   SpO2 95%   BMI 29.92 kg/m?   ? ?GEN: sleeping  ?RESP: equal chest rise and fall ?CVS:  tachycardic, regular rhythm on cardiac monitor  ? ? ?A/P: ?Patient able to answer questions appropriately.  Most recent CIWA 2.  RN reports improvement from previous.  ?No changes to current plan. See daily progress note.  ?- Orders reviewed. Labs for AM ordered, which was adjusted as needed.  ? , DO ?08/29/2021, 1:13 AM ?PGY-3, [facility] Night Resident  ?Please page [phone] with questions.  ? ?

## 2021-08-29 NOTE — Progress Notes (Signed)
Family Medicine Teaching Service ?Daily Progress Note ?Intern Pager: [phone] ? ?Patient name: Jamie Chavez Medical record number: [id number] ?Date of birth: [birth year] Age: 51 y.o. Gender: male ? ?Primary Care Provider: [name], DO ?Consultants: Gastroenterology ?Code Status: Full ? ?Pt Overview and Major Events to Date:  ?3/10- Admitted, GI consulted ?3/11- Worsening AMS, started on Librium taper and lactulose ? ?Assessment and Plan: ?Jamie Chavez is a 51 year-old male who presented with dizziness, diminished appetite and GI discomfort, and subsequently developed worsening AMS (now improved) in setting of alcoholic hepatitis and cirrhosis. Continued pancytopenia warranting observation. PMH significant for alcohol and tobacoc use, HTN, GERD, depression/anxiety. ? ?Metabolic encephalopathy with ETOH withdrawal/liver failure, improved ?AMS significantly improved from day or two ago, at baseline mentation. A&O x3. Denies hallucinations.No tenderness to palpation with abdominal exam. CIWA score of 2 overnight, significantly improved from prior days. Ordered Hepatitis C panel. Per GI, will order MRI with hemachromatosis protocol to further aid in diagnosis. ?- GI following, appreciate assistance ?- Continue librium taper ?- Continue lactulose and Xifaxan ?- Fall and seizure precautions ?- CIWA protocol q4h with re-check 1 hour s/p Ativan administration (if required) ?- Pending hemochromatosis DNA PCR ?- LR at 60ml/hr ?- Cardiac monitoring ?- Vaccines HAV and HBV outpatient ?- PT/OT eval and treat ? ?Pancytopenia, stable ?Macrocytic anemia with folate deficiency ?No clear etiology but question if medication-related with protonix. Also likely component of liver failure. Thrombocytopenia now stable at 28k without further drop in plt count. Hgb dropped from 10.7 to 8.9, although no signs of active bleeding. Concerned that if he goes home and has a fall/resumes ETOH use, bleeding risk would increase greatly. Consulted  hematology this morning for further assistance in workup. ?- Unable to pursue EGD/colonoscopy d/t thrombocytopenia and bleed risk ?- Folate supplementation ?- Hematology consulted, appreciate their care ?- Bismuth sulfate for GERD ?- Transfusion threshold for Hgb < 8.0 ?- Plt threshold < 10,000 or active signs of bleeding ?- Monitor with CBC ? ?Tobacco use ?Lung exam, ?- Nicotine patch as prescribed ?- Albuterol nebs q6h PRN for wheezing ? ?FEN/GI: Regular ?PPx: SCD's, no pharmacologic VTE prophylaxis given high bleeding risk ?Dispo:Home pending clinical improvement . Barriers include continued observation/inpatient workup for pancytopenia and AMS.  ? ?Subjective:  ?Feeling generally well without complaints. Eating and drinking normally. Says stools are getting lighter in color (initially black colored on admission, now more brown). Denies any hallucinations or anxiety.  ? ?Objective: ?Temp:  [97.9 ?F (36.6 ?C)-98.2 ?F (36.8 ?C)] 98.2 ?F (36.8 ?C) (03/12 0522) ?Pulse Rate:  [18-124] 107 (03/12 0522) ?Resp:  [19-22] 20 (03/12 0522) ?BP: (115-141)/(71-92) 134/86 (03/12 0522) ?SpO2:  [94 %-98 %] 95 % (03/12 0522) ?Physical Exam: ?General: Alert, NAD, resting comfortably in bed ?Cardiovascular: RRR, no murmurs ?Respiratory: Clear in all fields, no wheezing or crackles ?Abdomen: Soft, obese, non-tender, normoactive bowel sounds ?Extremities: Warm, dry, no edema ?Neuro: A&O x4. Speech clear and fluent. ?Psych: Pleasant. Normal mood and affect. ? ?Laboratory: ?Recent Labs  ?Lab 08/28/21 ?0705 08/28/21 ?1238 08/29/21 ?1017  ?WBC 3.1* 3.7* 3.5*  ?HGB 9.0* 10.7* 8.9*  ?HCT 25.9* 29.5* 26.0*  ?PLT 23* 28* 28*  ? ?Recent Labs  ?Lab 08/27/21 ?1223 08/28/21 ?0705 08/29/21 ?5102  ?NA 135 138 134*  ?K 4.0 3.9 3.5  ?CL 103 105 103  ?CO2 21* 23 22  ?BUN 15 13 13   ?CREATININE 0.86 0.81 0.65  ?CALCIUM 8.2* 8.5* 8.4*  ?PROT 7.0 6.4* 5.9*  ?BILITOT 9.0* 7.9* 7.3*  ?  ALKPHOS 72 69 69  ?ALT 67* 63* 69*  ?AST 186* 185* 178*  ?GLUCOSE 141*  118* 110*  ? ? ? ?Imaging/Diagnostic Tests: ?No results found. ? ? ?Darral Dash, DO ?08/29/2021, 7:21 AM ?PGY-1,  Family Medicine ?FPTS Intern pager: [phone], text pages welcome ? ?

## 2021-08-30 ENCOUNTER — Inpatient Hospital Stay (HOSPITAL_COMMUNITY): Payer: 59

## 2021-08-30 ENCOUNTER — Other Ambulatory Visit (HOSPITAL_COMMUNITY): Payer: Self-pay

## 2021-08-30 DIAGNOSIS — K7010 Alcoholic hepatitis without ascites: Secondary | ICD-10-CM | POA: Diagnosis not present

## 2021-08-30 DIAGNOSIS — K7030 Alcoholic cirrhosis of liver without ascites: Secondary | ICD-10-CM | POA: Diagnosis not present

## 2021-08-30 DIAGNOSIS — N179 Acute kidney failure, unspecified: Secondary | ICD-10-CM | POA: Diagnosis not present

## 2021-08-30 DIAGNOSIS — R42 Dizziness and giddiness: Secondary | ICD-10-CM | POA: Diagnosis not present

## 2021-08-30 LAB — MAGNESIUM: Magnesium: 1.7 mg/dL (ref 1.7–2.4)

## 2021-08-30 LAB — RETIC PANEL
Immature Retic Fract: 33.7 % — ABNORMAL HIGH (ref 2.3–15.9)
RBC.: 2.45 MIL/uL — ABNORMAL LOW (ref 4.22–5.81)
Retic Count, Absolute: 63 10*3/uL (ref 19.0–186.0)
Retic Ct Pct: 2.6 % (ref 0.4–3.1)
Reticulocyte Hemoglobin: 40.4 pg (ref >27.9)

## 2021-08-30 LAB — BASIC METABOLIC PANEL
Anion gap: 8 (ref 5–15)
BUN: 13 mg/dL (ref 6–20)
CO2: 24 mmol/L (ref 22–32)
Calcium: 8.6 mg/dL — ABNORMAL LOW (ref 8.9–10.3)
Chloride: 108 mmol/L (ref 98–111)
Creatinine, Ser: 0.62 mg/dL (ref 0.61–1.24)
GFR, Estimated: >60 mL/min (ref >60)
Glucose, Bld: 112 mg/dL — ABNORMAL HIGH (ref 70–99)
Potassium: 4.6 mmol/L (ref 3.5–5.1)
Sodium: 140 mmol/L (ref 135–145)

## 2021-08-30 LAB — PHOSPHORUS: Phosphorus: 3.2 mg/dL (ref 2.5–4.6)

## 2021-08-30 LAB — ANA: Anti Nuclear Antibody (ANA): NEGATIVE

## 2021-08-30 LAB — CBC
HCT: 26 % — ABNORMAL LOW (ref 39.0–52.0)
Hemoglobin: 9.5 g/dL — ABNORMAL LOW (ref 13.0–17.0)
MCH: 38.8 pg — ABNORMAL HIGH (ref 26.0–34.0)
MCHC: 36.5 g/dL — ABNORMAL HIGH (ref 30.0–36.0)
MCV: 106.1 fL — ABNORMAL HIGH (ref 80.0–100.0)
Platelets: 35 10*3/uL — ABNORMAL LOW (ref 150–400)
RBC: 2.45 MIL/uL — ABNORMAL LOW (ref 4.22–5.81)
RDW: 16.1 % — ABNORMAL HIGH (ref 11.5–15.5)
WBC: 3.1 10*3/uL — ABNORMAL LOW (ref 4.0–10.5)
nRBC: 0 % (ref 0.0–0.2)

## 2021-08-30 LAB — GLIA (IGA/G) + TTG IGA
Antigliadin Abs, IgA: 4 units (ref 0–19)
Gliadin IgG: 3 units (ref 0–19)
Tissue Transglutaminase Ab, IgA: <2 U/mL (ref 0–3)

## 2021-08-30 LAB — HEPATITIS B SURFACE ANTIBODY, QUANTITATIVE: Hep B S AB Quant (Post): <3.1 m[IU]/mL — ABNORMAL LOW (ref >9.9)

## 2021-08-30 LAB — AMMONIA: Ammonia: 61 umol/L — ABNORMAL HIGH (ref 9–35)

## 2021-08-30 LAB — IMMATURE PLATELET FRACTION: Immature Platelet Fraction: 4.9 % (ref 1.2–8.6)

## 2021-08-30 MED ORDER — ESCITALOPRAM OXALATE 10 MG PO TABS
10.0000 mg | ORAL_TABLET | Freq: Every day | ORAL | Status: DC
  Administered 2021-08-30 – 2021-08-31 (×2): 10 mg via ORAL
  Filled 2021-08-30 (×2): qty 1

## 2021-08-30 MED ORDER — GADOBUTROL 1 MMOL/ML IV SOLN
9.0000 mL | Freq: Once | INTRAVENOUS | Status: AC | PRN
  Administered 2021-08-30: 9 mL via INTRAVENOUS

## 2021-08-30 NOTE — Progress Notes (Signed)
OT Cancellation Note ? ?Patient Details ?Name: Jamie Chavez ?MRN: [id number] ?DOB: 1971/01/09 ? ? ?Cancelled Treatment:    Reason Eval/Treat Not Completed: Patient at procedure or test/ unavailable: Per RN, pt off floor for MRI. Will continue efforts.  ? ?Theodoro Clock ?08/30/2021, 2:19 PM ?

## 2021-08-30 NOTE — Progress Notes (Signed)
Physical Therapy Treatment ?Patient Details ?Name: Jamie Chavez ?MRN: [id number] ?DOB: 05/19/1971 ?Today's Date: 08/30/2021 ? ? ?History of Present Illness Jamie Chavez is a 51 y.o. male presenting with dizziness, diminished appetite and GI discomfort, found to have thrombocytopenia, alcoholic hepatitis and cirrhosis on laboratory and imaging studies, now with worsening thrombocytopenia.  PMH is significant for HTN, GERD, alcohol use, tobacco use, hx depression/anxiety. ? ?  ?PT Comments  ? ? Patient received in bed, agrees to PT session. Reports he is feeling better. HR in 110s at rest. Patient is independent with bed mobility, transfers and ambulation in room ~40 feet. No assistive device needed. He requires assist to manage lines. Due to increased HR ( up to 120s with minimal mobility) did not progress to hallway/stair training at this time. Patient will continue to benefit from skilled PT while here to improve functional independence for return home.    ?  ?Recommendations for follow up therapy are one component of a multi-disciplinary discharge planning process, led by the attending physician.  Recommendations may be updated based on patient status, additional functional criteria and insurance authorization. ? ?Follow Up Recommendations ? No PT follow up ?  ?  ?Assistance Recommended at Discharge PRN  ?Patient can return home with the following Help with stairs or ramp for entrance;Assistance with cooking/housework ?  ?Equipment Recommendations ? None recommended by PT  ?  ?Recommendations for Other Services   ? ? ?  ?Precautions / Restrictions Precautions ?Precautions: None ?Precaution Comments: low fall ?Restrictions ?Weight Bearing Restrictions: No  ?  ? ?Mobility ? Bed Mobility ?Overal bed mobility: Modified Independent ?Bed Mobility: Supine to Sit, Sit to Supine ?  ?  ?Supine to sit: Modified independent (Device/Increase time) ?Sit to supine: Modified independent (Device/Increase time) ?  ?  ?   ? ?Transfers ?Overall transfer level: Independent ?Equipment used: None ?Transfers: Sit to/from Stand ?Sit to Stand: Independent ?  ?  ?  ?  ?  ?  ?  ? ?Ambulation/Gait ?Ambulation/Gait assistance: Supervision ?Gait Distance (Feet): 40 Feet ?Assistive device: None ?Gait Pattern/deviations: Step-through pattern, WFL(Within Functional Limits) ?Gait velocity: decr ?  ?  ?General Gait Details: patient ambulating safely in room, no lob. ? ? ?Stairs ?  ?  ?  ?  ?  ? ? ?Wheelchair Mobility ?  ? ?Modified Rankin (Stroke Patients Only) ?  ? ? ?  ?Balance Overall balance assessment: Independent ?Sitting-balance support: Feet supported ?Sitting balance-Leahy Scale: Normal ?  ?  ?  ?Standing balance-Leahy Scale: Good ?Standing balance comment: no UE support, no assistance needed ambulating in room. Assist with lines only. ?  ?  ?  ?  ?  ?  ?  ?  ?  ?  ?  ?  ? ?  ?Cognition Arousal/Alertness: Awake/alert ?Behavior During Therapy: [facility] for tasks assessed/performed ?Overall Cognitive Status: Within Functional Limits for tasks assessed ?  ?  ?  ?  ?  ?  ?  ?  ?  ?  ?  ?  ?  ?  ?  ?  ?  ?  ?  ? ?  ?Exercises   ? ?  ?General Comments   ?  ?  ? ?Pertinent Vitals/Pain Pain Assessment ?Pain Assessment: No/denies pain  ? ? ?Home Living   ?  ?  ?  ?  ?  ?  ?  ?  ?  ?   ?  ?Prior Function    ?  ?  ?   ? ?  PT Goals (current goals can now be found in the care plan section) Acute Rehab PT Goals ?Patient Stated Goal: to go home ?PT Goal Formulation: With patient ?Time For Goal Achievement: 09/10/21 ?Potential to Achieve Goals: Good ?Progress towards PT goals: Progressing toward goals ? ?  ?Frequency ? ? ? Min 2X/week ? ? ? ?  ?PT Plan Discharge plan needs to be updated;Frequency needs to be updated  ? ? ?Co-evaluation   ?  ?  ?  ?  ? ?  ?AM-PAC PT "6 Clicks" Mobility   ?Outcome Measure ? Help needed turning from your back to your side while in a flat bed without using bedrails?: None ?Help needed moving from lying on your back to sitting on the  side of a flat bed without using bedrails?: None ?Help needed moving to and from a bed to a chair (including a wheelchair)?: None ?Help needed standing up from a chair using your arms (e.g., wheelchair or bedside chair)?: None ?Help needed to walk in hospital room?: None ?Help needed climbing 3-5 steps with a railing? : A Little ?6 Click Score: 23 ? ?  ?End of Session   ?Activity Tolerance: Patient tolerated treatment well ?Patient left: in bed;with call bell/phone within reach ?Nurse Communication: Mobility status ?PT Visit Diagnosis: Muscle weakness (generalized) (M62.81) ?  ? ? ?Time: 8850-2774 ?PT Time Calculation (min) (ACUTE ONLY): 10 min ? ?Charges:  $Gait Training: 8-22 mins          ?          ? ?Lissa Merlin, PT, GCS ?08/30/21,10:25 AM ? ?

## 2021-08-30 NOTE — Progress Notes (Signed)
UNASSIGNED PATIENT ?Subjective: ?Patient is just returned to his room after having an MR I done that revealed cirrhosis with steatosis and hepatosplenomegaly with portal high venous hypertension but no evidence of hepatocellular carcinoma or hemochromatosis.  Small volume abdominal ascites was noted with a right-sided colonic wall thickening most likely related to portal colopathy. ? ?Objective: ?Vital signs in last 24 hours: ?Temp:  [98.1 ?F (36.7 ?C)-98.7 ?F (37.1 ?C)] 98.2 ?F (36.8 ?C) (03/13 0545) ?Pulse Rate:  [74-108] 107 (03/13 0545) ?Resp:  [17-20] 17 (03/13 0545) ?BP: (122-148)/(72-89) 130/89 (03/13 0545) ?SpO2:  [96 %-97 %] 97 % (03/13 0545) ?Last BM Date : 08/29/21 ? ?Intake/Output from previous day: ?03/12 0701 - 03/13 0700 ?In: 4049.6 [I.V.:4049.6] ?Out: -  ?Intake/Output this shift: ?Total I/O ?In: 4049.6 [I.V.:4049.6] ?Out: -  ? ?General appearance: alert, cooperative, appears stated age, and morbidly obese ?Resp: clear to auscultation bilaterally ?Cardio: regular rate and rhythm, S1, S2 normal, no murmur, click, rub or gallop ?GI: soft, non-tender; bowel sounds normal; no masses,  no organomegaly ? ?Lab Results: ?Recent Labs  ?  08/28/21 ?1238 08/29/21 ?[id number] 08/30/21 ?0228  ?WBC 3.7* 3.5* 3.1*  ?HGB 10.7* 8.9* 9.5*  ?HCT 29.5* 26.0* 26.0*  ?PLT 28* 28* 35*  ? ?BMET ?Recent Labs  ?  08/28/21 ?0705 08/29/21 ?[id number] 08/30/21 ?0108  ?NA 138 134* 140  ?K 3.9 3.5 4.6  ?CL 105 103 108  ?CO2 23 22 24   ?GLUCOSE 118* 110* 112*  ?BUN 13 13 13   ?CREATININE 0.81 0.65 0.62  ?CALCIUM 8.5* 8.4* 8.6*  ? ?LFT ?Recent Labs  ?  08/29/21 ?[id number]  ?PROT 5.9*  ?ALBUMIN 2.4*  ?AST 178*  ?ALT 69*  ?ALKPHOS 69  ?BILITOT 7.3*  ? ? ?Medications: I have reviewed the patient's current medications. ?Prior to Admission:  ?Medications Prior to Admission  ?Medication Sig Dispense Refill Last Dose  ? escitalopram (LEXAPRO) 10 MG tablet Take 10 mg by mouth daily as needed (anxiety).   08/26/2021  ? loperamide (IMODIUM A-D) 2 MG tablet Take 2  mg by mouth daily.   08/26/2021  ? pantoprazole (PROTONIX) 40 MG tablet Take 1 tablet (40 mg total) by mouth 2 (two) times daily before a meal. 180 tablet 0 08/26/2021  ? valsartan (DIOVAN) 160 MG tablet Take 1 tablet (160 mg total) by mouth daily. With 40mg  tablet, to equal 200mg  daily. 90 tablet 1 08/26/2021  ? valsartan (DIOVAN) 40 MG tablet TAKE 1 TABLET BY MOUTH ONCE DAILY ALONG WITH 160MG  TO EQUAL 200MG  (Patient taking differently: Take 40 mg by mouth daily. TAKE WITH 160MG  TO EQUAL 200MG ) 90 tablet 1 08/26/2021  ? hydrochlorothiazide (HYDRODIURIL) 25 MG tablet Take 1 tablet (25 mg total) by mouth daily. 90 tablet 1 08/23/2021  ? ?Scheduled: ? chlordiazePOXIDE  25 mg Oral Daily  ? escitalopram  10 mg Oral Daily  ? folic acid  1 mg Oral Daily  ? lactulose  30 g Oral TID  ? multivitamin with minerals  1 tablet Oral Daily  ? nicotine  14 mg Transdermal Daily  ? rifaximin  550 mg Oral BID  ? thiamine  100 mg Oral Daily  ? Or  ? thiamine  100 mg Intravenous Daily  ? ?Continuous: ? ?Assessment/Plan: ?Alcoholic cirrhosis with alcoholic hepatitis and pancytopenia/macrocytic anemia-currently stable from a GI standpoint; no evidence of hemachromatosis on MRI. patient should follow up with Dr. Benson Norway on an OP basis. ? LOS: 4 days  ? ?Niyonna Betsill ?08/30/2021, 6:31 AM ? ? ?

## 2021-08-30 NOTE — TOC Benefit Eligibility Note (Signed)
Patient Advocate Encounter ? ?Prior Authorization for Xifaxan 550 mg tablets has been approved.   ? ?PA# [id number] ?Effective dates: 08/30/2021 through 08/30/2022 ? ?Patients co-pay is $40.00.  ? ? ? ?Roland Earl, CPhT ?Pharmacy Patient Advocate Specialist ?[facility] Patient Advocate Team ?Direct Number: [phone]  Fax: [phone]  ?

## 2021-08-30 NOTE — Progress Notes (Signed)
Family Medicine Teaching Service ?Daily Progress Note ?Intern Pager: [phone] ? ?Patient name: Jamie Chavez Medical record number: [id number] ?Date of birth: Feb 02, 1971 Age: 51 y.o. Gender: male ? ?Primary Care Provider: [name], DO ?Consultants: Gastroenterology, Hematology ?Code Status: Full ? ?Pt Overview and Major Events to Date:  ?3/10- Admitted, GI consulted ? ?Assessment and Plan: ?Jamie Chavez is a 51 year-old male who presented with GI discomfort, melenotic stools with concern for GI bleed, found to have alcoholic hepatitis and liver cirrhosis, now improving but with continued thrombocytopenia. PMH significant for HTN, GERD, alcohol use, tobacco use, hx depression/anxiety. ? ?Metabolic encephalopathy, ETOH hepatitis, liver cirrhosis  ?At baseline mentation. Reports he spoke with daughter yesterday and has plan when d/c to avoid alcohol. Out of withdrawal window. Doing well on current regimen. MRI abdomen ordered to workup for hemachromatosis. Ammonia downtrending at 61. ?- GI following ?- Librium taper ?- Continue lactulose ?- CIWA ?- MRI abdomen ?- Vaccines HAV and HBV outpatient ? ?Pancytopenia ?Macrocytic anemia with folate deficiency ?Hematology consulted yesterday. Thought that likely related to liver cirrhosis and acute alcohol toxicity. Blood smear without polychromia, target cells, schistocytes or giant platelets. Thrombocytopenia improving at 35k, up from 28k. Immature retic fract increased to 33.7. ?- Folate supplementation ?- Transfusion threshold for Hgb < 7.0 ?- Plt threshold < 10,000 or active signs of bleeding ?- Monitor with CBC ?- Plan for f/u with hematology outpatient in 3-4 weeks ? ?FEN/GI: Regular ?PPx: SCD's, no pharmacologic VTE prophylaxis given high-bleeding risk ?Dispo:Home in 2-3 days. Barriers include continued inpatient workup.  ? ?Subjective:  ?Feeling well. No complaints. Spoke with his daughter Fonda Kinder who is supportive and plans to help him with alcohol cesation when he  is discharged. He says he was being used as a "test dummy" for the lactulose that was making him have frequent bowel movements. Denies any pain or physical concerns.  ? ?Objective: ?Temp:  [98.1 ?F (36.7 ?C)-98.7 ?F (37.1 ?C)] 98.3 ?F (36.8 ?C) (03/13 7616) ?Pulse Rate:  [74-107] 100 (03/13 0733) ?Resp:  [17-20] 18 (03/13 0733) ?BP: (122-148)/(72-89) 125/74 (03/13 0737) ?SpO2:  [96 %-97 %] 96 % (03/13 0733) ?Physical Exam: ?General: Alert, NAD, resting in bed comfortably ?Cardiovascular: Regular rate ?Respiratory: Normal work of breathing on room air ?Abdomen: Soft, non-tender, mildly distended ?Extremities: Warm, dry ? ?Laboratory: ?Recent Labs  ?Lab 08/28/21 ?1238 08/29/21 ?1062 08/30/21 ?0228  ?WBC 3.7* 3.5* 3.1*  ?HGB 10.7* 8.9* 9.5*  ?HCT 29.5* 26.0* 26.0*  ?PLT 28* 28* 35*  ? ?Recent Labs  ?Lab 08/27/21 ?1223 08/28/21 ?0705 08/29/21 ?6948 08/30/21 ?0108  ?NA 135 138 134* 140  ?K 4.0 3.9 3.5 4.6  ?CL 103 105 103 108  ?CO2 21* 23 22 24   ?BUN 15 13 13 13   ?CREATININE 0.86 0.81 0.65 0.62  ?CALCIUM 8.2* 8.5* 8.4* 8.6*  ?PROT 7.0 6.4* 5.9*  --   ?BILITOT 9.0* 7.9* 7.3*  --   ?ALKPHOS 72 69 69  --   ?ALT 67* 63* 69*  --   ?AST 186* 185* 178*  --   ?GLUCOSE 141* 118* 110* 112*  ? ? ?Imaging/Diagnostic Tests: ?No results found. ? ? ? , DO ?08/30/2021, 9:25 AM ?PGY-1, [facility] ?FPTS Intern pager: [phone], text pages welcome ? ?

## 2021-08-30 NOTE — TOC Benefit Eligibility Note (Signed)
Patient Advocate Encounter ? ?Insurance verification completed.   ? ?The patient is currently admitted and upon discharge could be taking Xifaxan 550 mg tablets. ? ?Prior Authorization Required ? ?The patient is insured through [employer]  ? ? ? ?Lyndel Safe, CPhT ?Pharmacy Patient Advocate Specialist ?[place] Patient Advocate Team ?Direct Number: [phone]  Fax: [phone] ? ? ? ? ? ?  ?

## 2021-08-30 NOTE — Plan of Care (Signed)

## 2021-08-31 ENCOUNTER — Other Ambulatory Visit (HOSPITAL_COMMUNITY): Payer: Self-pay

## 2021-08-31 LAB — CBC
HCT: 25.5 % — ABNORMAL LOW (ref 39.0–52.0)
Hemoglobin: 9 g/dL — ABNORMAL LOW (ref 13.0–17.0)
MCH: 37.8 pg — ABNORMAL HIGH (ref 26.0–34.0)
MCHC: 35.3 g/dL (ref 30.0–36.0)
MCV: 107.1 fL — ABNORMAL HIGH (ref 80.0–100.0)
Platelets: 46 10*3/uL — ABNORMAL LOW (ref 150–400)
RBC: 2.38 MIL/uL — ABNORMAL LOW (ref 4.22–5.81)
RDW: 17.4 % — ABNORMAL HIGH (ref 11.5–15.5)
WBC: 3.3 10*3/uL — ABNORMAL LOW (ref 4.0–10.5)
nRBC: 0 % (ref 0.0–0.2)

## 2021-08-31 LAB — VITAMIN B1: Vitamin B1 (Thiamine): 67.9 nmol/L (ref 66.5–200.0)

## 2021-08-31 LAB — HAPTOGLOBIN: Haptoglobin: 60 mg/dL (ref 23–355)

## 2021-08-31 MED ORDER — LACTULOSE ENCEPHALOPATHY 10 GM/15ML PO SOLN
30.0000 g | Freq: Three times a day (TID) | ORAL | 0 refills | Status: DC
  Filled 2021-08-31: qty 1892, 14d supply, fill #0

## 2021-08-31 MED ORDER — HEPATITIS B VAC RECOMBINANT 10 MCG/ML IJ SUSP
1.0000 mL | Freq: Once | INTRAMUSCULAR | 0 refills | Status: DC
  Filled 2021-08-31: qty 1, 1d supply, fill #0

## 2021-08-31 MED ORDER — HEPATITIS B VAC RECOMBINANT 10 MCG/ML IJ SUSP
1.0000 mL | Freq: Once | INTRAMUSCULAR | Status: AC
  Administered 2021-08-31: 10 ug via INTRAMUSCULAR
  Filled 2021-08-31: qty 1

## 2021-08-31 MED ORDER — BISMUTH SUBSALICYLATE 262 MG PO CHEW
524.0000 mg | CHEWABLE_TABLET | ORAL | 0 refills | Status: DC | PRN
Start: 2021-08-31 — End: 2021-09-21
  Filled 2021-08-31: qty 30, 15d supply, fill #0

## 2021-08-31 MED ORDER — CERTAVITE/ANTIOXIDANTS PO TABS
1.0000 | ORAL_TABLET | Freq: Every day | ORAL | 0 refills | Status: AC
Start: 2021-08-31 — End: 2021-09-30
  Filled 2021-08-31: qty 30, 30d supply, fill #0

## 2021-08-31 MED ORDER — FOLIC ACID 1 MG PO TABS
1.0000 mg | ORAL_TABLET | Freq: Every day | ORAL | 0 refills | Status: DC
  Filled 2021-08-31: qty 30, 30d supply, fill #0

## 2021-08-31 MED ORDER — THIAMINE HCL 100 MG PO TABS
100.0000 mg | ORAL_TABLET | Freq: Every day | ORAL | 0 refills | Status: DC
  Filled 2021-08-31: qty 30, 30d supply, fill #0

## 2021-08-31 MED ORDER — RIFAXIMIN 550 MG PO TABS
550.0000 mg | ORAL_TABLET | Freq: Two times a day (BID) | ORAL | 0 refills | Status: DC
  Filled 2021-08-31: qty 60, 30d supply, fill #0

## 2021-08-31 NOTE — Progress Notes (Signed)
FPTS Interim Night Progress Note ? ?S:Patient sleeping comfortably.  Rounded with primary night RN.  No concerns voiced.  No orders required.   ? ?O: ?Today's Vitals  ? 08/30/21 2200 08/31/21 0120 08/31/21 0400 08/31/21 0600  ?BP: 111/67 111/70 107/66 100/63  ?Pulse: 97 95 94   ?Resp:  16 15   ?Temp:  98 ?F (36.7 ?C) 98 ?F (36.7 ?C)   ?TempSrc:  Oral Oral   ?SpO2:  98% 100%   ?Weight:      ?Height:      ?PainSc:      ? ? ? ? ?A/P: ?Continue current management ? ?Dana Allan MD ?PGY-3, [facility] ?Service pager [phone]   ?

## 2021-08-31 NOTE — Progress Notes (Signed)
CSW spoke with patient. He confirmed that he lives alone but his son and daughter live two houses down. CSW confirmed address is correct on [employer] and has PCP. Patient stated that he has been drinking too much lately and is tired of it and of being out of control. He accepted community resources and we discussed options, including virtual therapies since he does not live in [place]. He stated his family is supportive and will provide transport home. Meds were delivered to patient in the room. No other needs identified at this time.  ? ?Jamie Chavez ?LCSW, MSW, MHA ? ?

## 2021-08-31 NOTE — Discharge Instructions (Addendum)
Dear Jamie Chavez,  ? ?Thank you for letting us participate in your care! In this section, you will find a brief hospital admission summary of why you were admitted to the hospital, what happened during your admission, your diagnosis/diagnoses, and recommended follow up.  ?You were admitted because you were experiencing abdominal discomfort, dizziness, dark-colored stools.   ?You were diagnosed with cirrhosis of the liver, alcoholic hepatitis (inflammation of the liver), alcohol withdrawal. ?You were treated with lactulose (medicine to help prevent complications of liver disease), Ativan to prevent severe withdrawal, .  ?You were also seen by the gastroenterologists and hematologist (blood specialists). They recommended close outpatient follow-up and continuing to take medications as prescribed, abstaining from alcohol.  ?Your condition improved and you were discharged from the hospital for meeting this goal.  ? ? ?POST-HOSPITAL & CARE INSTRUCTIONS ?Please go to your appointment with Dr. Malachy Mood (hematology) and schedule an appointment Dr. Jeani Hawking (gastroenterologist) ?Please let PCP/Specialists know of any changes in medications that were made.  ?Please see medications section of this packet for any medication changes.  ? ?DOCTOR'S APPOINTMENTS & FOLLOW UP ? Follow-up Information   ? ? Jeani Hawking, MD. Schedule an appointment as soon as possible for a visit.   ?Specialty: Gastroenterology ?Contact information: ?[address] ?[place] [address] ?[phone] ? ? ?  ?  ? ? [name], DO. Schedule an appointment as soon as possible for a visit in 1 week(s).   ?Specialty: Family Medicine ?Contact information: ?[address] ?[address] ?[place] [address] ?[phone] ? ? ?  ?  ? ? Malachy Mood, MD. Go to.   ?Specialties: Hematology, Oncology ?Why: scheduled appointment. ?Contact information: ?[address] ?[place] [address] ?[phone] ? ? ?  ?  ? ?  ?  ? ?  ? ? ? ? ?Thank you  for choosing [facility]! Take care and be well! ? ?Family Medicine Teaching Service Inpatient Team ?[facility]  ?[facility]  ?[address] [place], [place] [address] ?[phone]  ?

## 2021-08-31 NOTE — Progress Notes (Signed)
The patient is doing great today, off Librium protocol. ?His Platelet trended up fine, and his hemoglobin remains stable at 9.0 ?No signs of active bleeding. ?Regarding his Liver Cirrhosis: an MRI of his abdomen was reviewed and discussed with him. ?GI evaluated him and recommended outpatient follow-up. ?He is stable for home d/c with close PCP follow-up. ?

## 2021-08-31 NOTE — Progress Notes (Signed)
CSW attempted to see patient for substance use consult, however OT in room. Will attempt again later.  ? ?Cristobal Goldmann ?LCSW, MSW, MHA ? ?

## 2021-08-31 NOTE — Discharge Summary (Addendum)
Family Medicine Teaching Service ?Hospital Discharge Summary ? ?Patient name: Jamie Chavez Medical record number: [id number] ?Date of birth: 1970-12-07 Age: 51 y.o. Gender: male ?Date of Admission: 08/26/2021  Date of Discharge: 08/31/2021 ?Admitting Physician: Doreene ElandKehinde T Eniola, MD ? ?Primary Care Provider: Tommie Samsook, Jayce G, DO ?Consultants: Gastroenterology, Hematology, Critical Care Medicine ? ?Indication for Hospitalization: Dizziness, dark-colored stools ? ?Discharge Diagnoses/Problem List:  ?Principal Problem: ?  Alcoholic hepatitis ?Active Problems: ?  Alcoholic cirrhosis, unspecified whether ascites present (HCC) ?  AKI (acute kidney injury) (HCC) ?  Dizziness ?  Heme positive stool ?  LFT elevation ?  Thrombocytopenia (HCC) ?  Other encephalopathy ? ? ? ?Disposition: Home ? ?Discharge Condition: Stable ? ?Discharge Exam:  ?General: Alert, NAD, resting in bed comfortably ?HEENT: Pupils PERRLA. Scleral icterus bilateral eyes. ?Cardiovascular: Regular rate ?Respiratory: Normal work of breathing on room air ?Abdomen: Soft, non-tender, mildly distended ?Extremities: Warm, dry ?Neuro: Awake, alert, oriented. No focal deficits. ? ?Brief Hospital Course:  ?Jamie Chavez is a 51 y.o.male with a history of HTN, GERD, alcohol and tobacco use, depression/anxiety [facility] and new onset alcoholic hepatitis and cirrhosis. His hospital course is detailed below: ? ?Alcoholic liver cirrhosis with transaminitis ?Patient presented to ED with dizziness, diminished appetite worsening for 2 weeks and abdominal discomfort.  Also notably has family history of hemochromatosis.  RUQ U/S showed cirrhotic appearance of liver with cholelithiasis.  CT head negative.  Bilirubin elevated to 6.1, T bili 6.7, AST/ALT 174/67.  Patient was admitted for monitoring for alcohol withdrawal and GI work-up. MRI confirmed liver cirrhosis but had no findings suggestive of  hemachromatosis. GI was consulted and he was treated with lactulose as well as rifaximin and received hepatitis B vaccine prior to discharge.  ? ?Alcohol dependence  AMS ?Patient presented hemodynamically stable and alert and oriented.  Given significant alcohol use, CIWA protocol was put into place.  Ammonia level elevated to 60s, CCM consulted on the second day of admission as patient began having confusion, agitation, hallucinations.  Ultimately, he continued to be managed on the floor and placed on a Librium taper with as needed Ativan per CIWA protocol in addition to one-to-one observation. He was provided with alcohol cessation resources. ? ?Macrocytic anemia  thrombocytopenia ?Presented with hemoglobin 8.5 and FOBT positive without acute signs of bleeding but endorsing several weeks of dark-colored stools.  GI recommended EGD/colonoscopy to evaluate for varices and heme positive stool however this was delayed given platelets 34 upon presentation. This was suspected to be related to liver cirrhosis Hemoglobin platelets monitored closely during hospitalization with transfusion threshold of hemoglobin <7 and plts <10. He did not require any blood transfusions or platelets while hospitalized. ? ?Other chronic conditions were medically managed with home medications and formulary alternatives as necessary (Depression/anxiety) ? ?PCP Follow-up Recommendations: ?Ensure follow up with gastroenterology (Dr. Jeani HawkingPatrick Hung)  ?Ensure he is taking medications as prescribed. He reported taking Lexapro PRN, but was educated to take SSRI daily. ?Ensure hematology follow-up in 3-4 weeks after discharge ?Repeat CBC, CMP ?HCTZ held at discharge due to normotensive/intermittently hypotensive BP while inpatient. Resume as appropriate. ?Protonix discontinued due to thrombocytopenia. Pepto Bismol initiated in place. ? ? ?Significant Procedures: None ? ?Significant Labs and Imaging:  ?Recent Labs  ?Lab 08/29/21 ?[id number] 08/30/21 ?0228  08/31/21 ?[id number]  ?WBC 3.5* 3.1* 3.3*  ?HGB 8.9* 9.5* 9.0*  ?HCT 26.0* 26.0* 25.5*  ?PLT 28* 35* 46*  ? ?  Recent Labs  ?Lab 08/26/21 ?1002 08/26/21 ?1920 08/27/21 ?0636 08/27/21 ?1223 08/28/21 ?0626 08/29/21 ?9485 08/30/21 ?0108  ?NA 133*  --  133* 135 138 134* 140  ?K 4.2  --  4.0 4.0 3.9 3.5 4.6  ?CL 97*  --  100 103 105 103 108  ?CO2 15*  --  21* 21* 23 22 24   ?GLUCOSE 148*  --  124* 141* 118* 110* 112*  ?BUN 20  --  16 15 13 13 13   ?CREATININE 1.35*  --  0.85 0.86 0.81 0.65 0.62  ?CALCIUM 8.9  --  8.2* 8.2* 8.5* 8.4* 8.6*  ?MG  --  1.2* 2.2  --  1.8  --  1.7  ?PHOS  --  2.2* 2.2*  --  2.6  --  3.2  ?ALKPHOS 86  --  74 72 69 69  --   ?AST 174*  --  163* 186* 185* 178*  --   ?ALT 67*  --  62* 67* 63* 69*  --   ?ALBUMIN 3.2*  --  2.7* 2.8* 2.5* 2.4*  --   ? ? ?Results/Tests Pending at Time of Discharge: Hemochromatosis DNA-PCR ? ?Discharge Medications:  ?Allergies as of 08/31/2021   ?No Known Allergies ?  ? ?  ?Medication List  ?  ? ?STOP taking these medications   ? ?hydrochlorothiazide 25 MG tablet ?Commonly known as: HYDRODIURIL ?  ?loperamide 2 MG tablet ?Commonly known as: IMODIUM A-D ?  ?pantoprazole 40 MG tablet ?Commonly known as: PROTONIX ?  ? ?  ? ?TAKE these medications   ? ?bismuth subsalicylate 262 MG chewable tablet ?Commonly known as: PEPTO BISMOL ?Chew 2 tablets (524 mg total) by mouth as needed for indigestion. ?  ?CertaVite/Antioxidants Tabs ?Take 1 tablet by mouth daily. ?  ?escitalopram 10 MG tablet ?Commonly known as: LEXAPRO ?Take 10 mg by mouth daily as needed (anxiety). ?  ?folic acid 1 MG tablet ?Commonly known as: FOLVITE ?Take 1 tablet (1 mg total) by mouth daily. ?  ?lactulose (encephalopathy) 10 GM/15ML Soln ?Commonly known as: CHRONULAC ?Take 45 mLs (30 g total) by mouth 3 (three) times daily. ?  ?thiamine 100 MG tablet ?Take 1 tablet (100 mg total) by mouth daily. ?  ?valsartan 160 MG tablet ?Commonly known as: DIOVAN ?Take 1 tablet (160 mg total) by mouth daily. With 40mg  tablet, to  equal 200mg  daily. ?What changed: Another medication with the same name was changed. Make sure you understand how and when to take each. ?  ?valsartan 40 MG tablet ?Commonly known as: DIOVAN ?TAKE 1 TABLET BY MOUTH ONCE DAILY ALONG WITH 160MG  TO EQUAL 200MG  ?What changed:  ?how much to take ?how to take this ?when to take this ?additional instructions ?  ?Xifaxan 550 MG Tabs tablet ?Generic drug: rifaximin ?Take 1 tablet (550 mg total) by mouth 2 (two) times daily. ?  ? ?  ? ? ?Discharge Instructions: Please refer to Patient Instructions section of EMR for full details.  Patient was counseled important signs and symptoms that should prompt return to medical care, changes in medications, dietary instructions, activity restrictions, and follow up appointments.  ? ?Follow-Up Appointments: ? Follow-up Information   ? ? , MD. Schedule an appointment as soon as possible for a visit.   ?Specialty: Gastroenterology ?Contact information: ?[address], [address] ?[place]  ?[phone] ? ? ?  ?  ? ? , DO. Schedule an appointment as soon as possible for a visit in 1 week(s).   ?  Specialty: Family Medicine ?Contact information: ?[address] ?[address] ?[place] [address] ?[phone] ? ? ?  ?  ? ? Malachy Mood, MD. Go to.   ?Specialties: Hematology, Oncology ?Why: scheduled appointment. ?Contact information: ?[address] ?[place] [address] ?[phone] ? ? ?  ?  ? ?  ?  ? ?  ? ? ?Cora Collum, DO ?08/31/2021, 4:13 PM ?PGY-1, [facility]  ?

## 2021-08-31 NOTE — Progress Notes (Signed)
Occupational Therapy Treatment ?Patient Details ?Name: Jamie Chavez ?MRN: [id number] ?DOB: 09/29/1970 ?Today's Date: 08/31/2021 ? ? ?History of present illness Jamie Chavez is a 50 y.o. male presenting with dizziness, diminished appetite and GI discomfort, found to have thrombocytopenia, alcoholic hepatitis and cirrhosis on laboratory and imaging studies, now with worsening thrombocytopenia.  PMH is significant for HTN, GERD, alcohol use, tobacco use, hx depression/anxiety. ?  ?OT comments ? Pt currently independent to modified independent for selfcare tasks and mobility.  No DME needs or follow up OT recommended at this time.  Pt discharged from acute care OT services.   ? ?Recommendations for follow up therapy are one component of a multi-disciplinary discharge planning process, led by the attending physician.  Recommendations may be updated based on patient status, additional functional criteria and insurance authorization. ?   ?Follow Up Recommendations ? No OT follow up  ?  ?Assistance Recommended at Discharge PRN  ?   ?Equipment Recommendations ? None recommended by OT  ?  ?Recommendations for Other Services   ? ?  ?Precautions / Restrictions Precautions ?Precautions: None ?Restrictions ?Weight Bearing Restrictions: No  ? ? ?  ? ?Mobility Bed Mobility ?Overal bed mobility: Independent ?  ?  ?  ?  ?  ?  ?  ?  ? ?Transfers ?Overall transfer level: Independent ?Equipment used: None ?Transfers: Sit to/from Stand ?Sit to Stand: Independent ?  ?  ?Step pivot transfers: Independent ?  ?  ?  ?  ?  ?Balance Overall balance assessment: Independent ?Sitting-balance support: Feet supported ?Sitting balance-Leahy Scale: Normal ?  ?  ?  ?Standing balance-Leahy Scale: Good ?  ?  ?  ?  ?  ?  ?  ?  ?  ?  ?  ?  ?   ? ?ADL either performed or assessed with clinical judgement  ? ?ADL Overall ADL's : Modified independent ?  ?  ?  ?  ?  ?  ?  ?  ?  ?  ?  ?  ?  ?  ?  ?  ?  ?  ?  ?General ADL Comments: Pt is currently modified  independent for selfcare tasks and transfers without an assistive device, just at a slightly slower rate of speed than normal.  No futher OT needs at this time. ?  ? ?Extremity/Trunk Assessment   ?  ?  ?  ? ?   ?  ?  ?   ?  ? ?Cognition Arousal/Alertness: Awake/alert ?Behavior During Therapy: WFL for tasks assessed/performed ?Overall Cognitive Status: Within Functional Limits for tasks assessed ?  ?  ?  ?  ?  ?  ?  ?  ?  ?  ?  ?  ?  ?  ?  ?  ?  ?  ?  ?   ?   ?   ?General Comments    ? ? ?Pertinent Vitals/ Pain       Pain Assessment ?Pain Assessment: No/denies pain ? ?Home Living   ?   ? ?   ?Progress Toward Goals ? ?OT Goals(current goals can now be found in the care plan section) ? Progress towards OT goals: Goals met/education completed, patient discharged from OT ? ?   ?Plan All goals met and education completed, patient discharged from OT services   ? ?Co-evaluation ? ? ?   ?  ?  ?  ?  ? ?  ?AM-PAC OT "6 Clicks" Daily Activity     ?  Outcome Measure ? ? Help from another person eating meals?: None ?Help from another person taking care of personal grooming?: None ?Help from another person toileting, which includes using toliet, bedpan, or urinal?: None ?Help from another person bathing (including washing, rinsing, drying)?: None ?Help from another person to put on and taking off regular upper body clothing?: None ?Help from another person to put on and taking off regular lower body clothing?: None ?6 Click Score: 24 ? ?  ?End of Session Equipment Utilized During Treatment: Gait belt ? ?  ?  ?Activity Tolerance Patient tolerated treatment well ?  ?Patient Left in bed;with call bell/phone within reach;with nursing/sitter in room ?  ?Nurse Communication Mobility status ?  ? ?   ? ?Time: 1112-1142 ?OT Time Calculation (min): 30 min ? ?Charges: OT General Charges ?$OT Visit: 1 Visit ?OT Treatments ?$Therapeutic Activity: 23-37 mins ? ?MCGUIRE,JAMES OTR/L ?08/31/2021, 1:14 PM ?

## 2021-08-31 NOTE — Progress Notes (Signed)
Jamie Chavez   DOB:Apr 05, 1971   [id number]   [id number] ? ?Hematology follow up note ? ?Subjective: Patient was sleeping when I saw him this morning.  After I woke him up, he was started confused, disorientated.  He denies pain or other new discomfort, no bleeding. ? ? ?Objective:  ?Vitals:  ? 08/31/21 0723 08/31/21 1141  ?BP: 97/67 (!) 106/58  ?Pulse: 90 94  ?Resp: 15 16  ?Temp: 97.9 ?F (36.6 ?C) 97.6 ?F (36.4 ?C)  ?SpO2: 100% 94%  ?  Body mass index is 29.92 kg/m?Marland Kitchen ?No intake or output data in the 24 hours ending 08/31/21 1518 ? ? Sclerae icteric ? No ecchymosis or petechia  ?No leg edema  ?  ? ?CBG (last 3)  ?No results for input(s): GLUCAP in the last 72 hours. ? ? ?Labs:  ? ?Urine Studies ?No results for input(s): UHGB, CRYS in the last 72 hours. ? ?Invalid input(s): UACOL, UAPR, USPG, UPH, UTP, UGL, UKET, UBIL, UNIT, UROB, ULEU, UEPI, UWBC, URBC, UBAC, CAST, UCOM, BILUA ? ?Basic Metabolic Panel: ?Recent Labs  ?Lab 08/26/21 ?1920 08/27/21 ?0636 08/27/21 ?1223 08/28/21 ?[id number] 08/29/21 ?[id number] 08/30/21 ?0108  ?NA  --  133* 135 138 134* 140  ?K  --  4.0 4.0 3.9 3.5 4.6  ?CL  --  100 103 105 103 108  ?CO2  --  21* 21* 23 22 24   ?GLUCOSE  --  124* 141* 118* 110* 112*  ?BUN  --  16 15 13 13 13   ?CREATININE  --  0.85 0.86 0.81 0.65 0.62  ?CALCIUM  --  8.2* 8.2* 8.5* 8.4* 8.6*  ?MG 1.2* 2.2  --  1.8  --  1.7  ?PHOS 2.2* 2.2*  --  2.6  --  3.2  ? ?GFR ?Estimated Creatinine Clearance: 125.1 mL/min (by C-G formula based on SCr of 0.62 mg/dL). ?Liver Function Tests: ?Recent Labs  ?Lab 08/26/21 ?1002 08/27/21 ?0636 08/27/21 ?1223 08/28/21 ?0705 08/29/21 ?[id number]  ?AST 174* 163* 186* 185* 178*  ?ALT 67* 62* 67* 63* 69*  ?ALKPHOS 86 74 72 69 69  ?BILITOT 6.7* 8.2* 9.0* 7.9* 7.3*  ?PROT 7.8 6.5 7.0 6.4* 5.9*  ?ALBUMIN 3.2* 2.7* 2.8* 2.5* 2.4*  ? ?Recent Labs  ?Lab 08/26/21 ?1002  ?LIPASE 60*  ? ?Recent Labs  ?Lab 08/26/21 ?1738 08/27/21 ?1223 08/30/21 ?0108  ?AMMONIA 62* 65* 61*  ? ?Coagulation profile ?Recent Labs  ?Lab  08/26/21 ?1738  ?INR 1.5*  ? ? ?CBC: ?Recent Labs  ?Lab 08/28/21 ?0705 08/28/21 ?1238 08/29/21 ?[id number] 08/30/21 ?0228 08/31/21 ?[id number]  ?WBC 3.1* 3.7* 3.5* 3.1* 3.3*  ?NEUTROABS 1.7  --   --   --   --   ?HGB 9.0* 10.7* 8.9* 9.5* 9.0*  ?HCT 25.9* 29.5* 26.0* 26.0* 25.5*  ?MCV 106.6* 105.4* 107.0* 106.1* 107.1*  ?PLT 23* 28* 28* 35* 46*  ? ?Cardiac Enzymes: ?No results for input(s): CKTOTAL, CKMB, CKMBINDEX, TROPONINI in the last 168 hours. ?BNP: ?Invalid input(s): POCBNP ?CBG: ?Recent Labs  ?Lab 08/26/21 ?1001  ?GLUCAP 146*  ? ?D-Dimer ?No results for input(s): DDIMER in the last 72 hours. ?Hgb A1c ?No results for input(s): HGBA1C in the last 72 hours. ?Lipid Profile ?No results for input(s): CHOL, HDL, LDLCALC, TRIG, CHOLHDL, LDLDIRECT in the last 72 hours. ?Thyroid function studies ?No results for input(s): TSH, T4TOTAL, T3FREE, THYROIDAB in the last 72 hours. ? ?Invalid input(s): FREET3 ?Anemia work up ?Recent Labs  ?  08/30/21 ?0228  ?RETICCTPCT 2.6  ? ?Microbiology ?Recent Results (from  the past 240 hour(s))  ?Resp Panel by RT-PCR (Flu A&B, Covid) Nasopharyngeal Swab     Status: None  ? Collection Time: 08/26/21  1:29 PM  ? Specimen: Nasopharyngeal Swab; Nasopharyngeal(NP) swabs in vial transport medium  ?Result Value Ref Range Status  ? SARS Coronavirus 2 by RT PCR NEGATIVE NEGATIVE Final  ?  Comment: (NOTE) ?SARS-CoV-2 target nucleic acids are NOT DETECTED. ? ?The SARS-CoV-2 RNA is generally detectable in upper respiratory ?specimens during the acute phase of infection. The lowest ?concentration of SARS-CoV-2 viral copies this assay can detect is ?138 copies/mL. A negative result does not preclude SARS-Cov-2 ?infection and should not be used as the sole basis for treatment or ?other patient management decisions. A negative result may occur with  ?improper specimen collection/handling, submission of specimen other ?than nasopharyngeal swab, presence of viral mutation(s) within the ?areas targeted by this assay, and  inadequate number of viral ?copies(<138 copies/mL). A negative result must be combined with ?clinical observations, patient history, and epidemiological ?information. The expected result is Negative. ? ?Fact Sheet for Patients:  ?EntrepreneurPulse.com.au ? ?Fact Sheet for Healthcare Providers:  ?IncredibleEmployment.be ? ?This test is no t yet approved or cleared by the [place] FDA and  ?has been authorized for detection and/or diagnosis of SARS-CoV-2 by ?FDA under an Emergency Use Authorization (EUA). This EUA will remain  ?in effect (meaning this test can be used) for the duration of the ?COVID-19 declaration under Section 564(b)(1) of the Act, 21 ?U.S.C.section 360bbb-3(b)(1), unless the authorization is terminated  ?or revoked sooner.  ? ? ?  ? Influenza A by PCR NEGATIVE NEGATIVE Final  ? Influenza B by PCR NEGATIVE NEGATIVE Final  ?  Comment: (NOTE) ?The Xpert Xpress SARS-CoV-2/FLU/RSV plus assay is intended as an aid ?in the diagnosis of influenza from Nasopharyngeal swab specimens and ?should not be used as a sole basis for treatment. Nasal washings and ?aspirates are unacceptable for Xpert Xpress SARS-CoV-2/FLU/RSV ?testing. ? ?Fact Sheet for Patients: ?EntrepreneurPulse.com.au ? ?Fact Sheet for Healthcare Providers: ?IncredibleEmployment.be ? ?This test is not yet approved or cleared by the [place] FDA and ?has been authorized for detection and/or diagnosis of SARS-CoV-2 by ?FDA under an Emergency Use Authorization (EUA). This EUA will remain ?in effect (meaning this test can be used) for the duration of the ?COVID-19 declaration under Section 564(b)(1) of the Act, 21 U.S.C. ?section 360bbb-3(b)(1), unless the authorization is terminated or ?revoked. ? ?Performed at [facility], [place] [address] [place], [place] ?[address] ?  ? ? ? ? ?Studies:  ?MR ABDOMEN W WO CONTRAST ? ?Result Date: 08/30/2021 ?CLINICAL DATA:   Alcoholic hepatitis and cirrhosis. Potential hemochromatosis. EXAM: MRI ABDOMEN WITHOUT AND WITH CONTRAST TECHNIQUE: Multiplanar multisequence MR imaging of the abdomen was performed both before and after the administration of intravenous contrast. CONTRAST:  35mL GADAVIST GADOBUTROL 1 MMOL/ML IV SOLN COMPARISON:  08/26/2021 abdominal ultrasound. FINDINGS: Mild motion degradation, especially involving the in and out of phase and pre and postcontrast dynamic images. Lower chest: Normal heart size without pericardial or pleural effusion. Hepatobiliary: Mild hepatomegaly at 20.5 cm craniocaudal. Mild hepatic steatosis. No findings of hemochromatosis. Moderate cirrhosis. Innumerable tiny hypoenhancing foci throughout the liver are consistent with regenerative nodules. The arterial phase images are poorly timed, and no true arterial phase imaging is available. Given this mild limitation, no evidence of hepatocellular carcinoma. The gallbladder is mildly distended, but no gallstones are seen. No biliary duct dilatation. Pancreas:  Normal, without mass or ductal dilatation. Spleen:  Splenomegaly at 16.0 cm craniocaudal. Adrenals/Urinary Tract: Normal adrenal glands. Normal kidneys, without hydronephrosis. Stomach/Bowel: Proximal gastric underdistention. Normal small bowel. The right colon is thick walled, including on 32/5. Vascular/Lymphatic: Normal caliber of the aorta and branch vessels. Patent portal and splenic veins. Portal venous hypertension, with recannulized paraumbilical vein. No retroperitoneal or retrocrural adenopathy. Other:  Small volume perihepatic and perisplenic ascites. Musculoskeletal: No acute osseous abnormality. IMPRESSION: 1. Mild motion degradation, as detailed above. 2. Cirrhosis, steatosis, hepatosplenomegaly and portal venous hypertension. No evidence of hepatocellular carcinoma. Limited evaluation for, but no evidence of hemochromatosis. 3. 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 Infectious or inflammatory colitis could look similar. 4. Small volume abdominal ascites. Electronically Signed   By: Abigail Miyamoto M.D.   On: 08/30/2021 15:57   ? ?Assessment: 51 y.o. m

## 2021-09-01 ENCOUNTER — Telehealth: Payer: Self-pay

## 2021-09-01 ENCOUNTER — Telehealth: Payer: Self-pay | Admitting: Hematology

## 2021-09-01 LAB — HEMOCHROMATOSIS DNA-PCR(C282Y,H63D)

## 2021-09-01 NOTE — Telephone Encounter (Signed)
Transition Care Management Unsuccessful Follow-up Telephone Call ? ?Date of discharge and from where:  08/31/21 Jamie Chavez ? ?Attempts:  1st Attempt ? ?Reason for unsuccessful TCM follow-up call:  Left voice message ? ? ? ?

## 2021-09-01 NOTE — Telephone Encounter (Signed)
Transition Care Management Follow-up Telephone Call ?Date of discharge and from where: 08/31/21 [facility] ?How have you been since you were released from the hospital? Pt states he is doing okay, just tired. ?Any questions or concerns? No ? ?Items Reviewed: ?Did the pt receive and understand the discharge instructions provided? Y ? ?Medications obtained and verified? Yes  ?Other? No  ?Any new allergies since your discharge? No  ?Dietary orders reviewed? Yes ?Do you have support at home? Yes  ? ?Home Care and Equipment/Supplies: ?Were home health services ordered? not applicable ?If so, what is the name of the agency?   ?Has the agency set up a time to come to the patient's home? not applicable ?Were any new equipment or medical supplies ordered?  No ?What is the name of the medical supply agency?  ?Were you able to get the supplies/equipment? not applicable ?Do you have any questions related to the use of the equipment or supplies? No ? ?Functional Questionnaire: (I = Independent and D = Dependent) ?ADLs: I ? ?Bathing/Dressing- I ? ?Meal Prep- I ? ?Eating- I ? ?Maintaining continence- I ? ?Transferring/Ambulation- I ? ?Managing Meds- I ? ?Follow up appointments reviewed: ? ?PCP Hospital f/u appt confirmed? Yes  Scheduled to see Dr. Adriana Simas  on 09/03/21 @ 10:00. ?Specialist Hospital f/u appt confirmed? Yes  Scheduled to see Dr. Mosetta Putt on 10/01/21 @ 3:20. ?Are transportation arrangements needed? No  ?If their condition worsens, is the pt aware to call PCP or go to the Emergency Dept.? Yes ?Was the patient provided with contact information for the PCP's office or ED? Yes ?Was to pt encouraged to call back with questions or concerns? Yes ? ?

## 2021-09-01 NOTE — Telephone Encounter (Signed)
Scheduled appt per 3/14 staff msg from Dr. Burr Medico. Called pt, no answer. Left msg with appt date and time. Requested for pt to call back to confirm appt.  ?

## 2021-09-03 ENCOUNTER — Telehealth: Payer: Self-pay | Admitting: Family Medicine

## 2021-09-03 ENCOUNTER — Inpatient Hospital Stay: Payer: 59 | Admitting: Family Medicine

## 2021-09-03 DIAGNOSIS — D61818 Other pancytopenia: Secondary | ICD-10-CM | POA: Insufficient documentation

## 2021-09-03 NOTE — Telephone Encounter (Signed)
Patient is needing work note from being in hospital 3/9/-3/14 and until he is seen on 3/21  and maybe return on 3/22 mat need extension after visit on 3/21 ?

## 2021-09-06 ENCOUNTER — Encounter: Payer: Self-pay | Admitting: Family Medicine

## 2021-09-06 NOTE — Telephone Encounter (Signed)
[name], DO   ? ?Okay to provide work note.   ? ?

## 2021-09-07 ENCOUNTER — Other Ambulatory Visit (HOSPITAL_COMMUNITY)
Admission: RE | Admit: 2021-09-07 | Discharge: 2021-09-07 | Disposition: A | Discharge status: Home / Self Care | Payer: 59 | Source: Ambulatory Visit | Attending: Family Medicine | Admitting: Family Medicine

## 2021-09-07 ENCOUNTER — Other Ambulatory Visit: Payer: Self-pay

## 2021-09-07 ENCOUNTER — Inpatient Hospital Stay: Payer: 59 | Admitting: Family Medicine

## 2021-09-07 ENCOUNTER — Ambulatory Visit (INDEPENDENT_AMBULATORY_CARE_PROVIDER_SITE_OTHER): Payer: 59 | Admitting: Family Medicine

## 2021-09-07 VITALS — BP 102/68 | HR 101 | Temp 98.9°F | Ht 69.0 in | Wt 206.6 lb

## 2021-09-07 DIAGNOSIS — E785 Hyperlipidemia, unspecified: Secondary | ICD-10-CM | POA: Insufficient documentation

## 2021-09-07 DIAGNOSIS — D61818 Other pancytopenia: Secondary | ICD-10-CM | POA: Diagnosis not present

## 2021-09-07 DIAGNOSIS — K7030 Alcoholic cirrhosis of liver without ascites: Secondary | ICD-10-CM

## 2021-09-07 DIAGNOSIS — I10 Essential (primary) hypertension: Secondary | ICD-10-CM | POA: Diagnosis not present

## 2021-09-07 LAB — CBC
HCT: 33.4 % — ABNORMAL LOW (ref 39.0–52.0)
Hemoglobin: 11 g/dL — ABNORMAL LOW (ref 13.0–17.0)
MCH: 37.5 pg — ABNORMAL HIGH (ref 26.0–34.0)
MCHC: 32.9 g/dL (ref 30.0–36.0)
MCV: 114 fL — ABNORMAL HIGH (ref 80.0–100.0)
Platelets: 85 10*3/uL — ABNORMAL LOW (ref 150–400)
RBC: 2.93 MIL/uL — ABNORMAL LOW (ref 4.22–5.81)
RDW: 17.5 % — ABNORMAL HIGH (ref 11.5–15.5)
WBC: 5 10*3/uL (ref 4.0–10.5)
nRBC: 0 % (ref 0.0–0.2)

## 2021-09-07 LAB — COMPREHENSIVE METABOLIC PANEL
ALT: 85 U/L — ABNORMAL HIGH (ref 0–44)
AST: 173 U/L — ABNORMAL HIGH (ref 15–41)
Albumin: 2.9 g/dL — ABNORMAL LOW (ref 3.5–5.0)
Alkaline Phosphatase: 80 U/L (ref 38–126)
Anion gap: 6 (ref 5–15)
BUN: 21 mg/dL — ABNORMAL HIGH (ref 6–20)
CO2: 20 mmol/L — ABNORMAL LOW (ref 22–32)
Calcium: 9.1 mg/dL (ref 8.9–10.3)
Chloride: 110 mmol/L (ref 98–111)
Creatinine, Ser: 0.74 mg/dL (ref 0.61–1.24)
GFR, Estimated: >60 mL/min (ref >60)
Glucose, Bld: 125 mg/dL — ABNORMAL HIGH (ref 70–99)
Potassium: 4.1 mmol/L (ref 3.5–5.1)
Sodium: 136 mmol/L (ref 135–145)
Total Bilirubin: 6.2 mg/dL — ABNORMAL HIGH (ref 0.3–1.2)
Total Protein: 7.5 g/dL (ref 6.5–8.1)

## 2021-09-07 MED ORDER — FAMOTIDINE 20 MG PO TABS: 20.0000 mg | ORAL_TABLET | Freq: Two times a day (BID) | ORAL | 3 refills | Status: DC

## 2021-09-07 NOTE — Assessment & Plan Note (Signed)
Stat labs obtained today given patient's ongoing symptoms.  WBC, hemoglobin, and platelet count improved.  Renal function stable. ?Advise close follow-up with GI.  Patient adamant that he does not plan on drinking alcohol.  Patient needs to stay sober so that he can be considered for transplant in the future.  MELD score 19 (currently based on current labs and last INR). ?

## 2021-09-07 NOTE — Progress Notes (Signed)
? ?Subjective:  ?Patient ID: Jamie Chavez, male    DOB: [birth year]  Age: 51 y.o. MRN: [id number] ? ?CC: ?Chief Complaint  ?Patient presents with  ? Hospitalization Follow-up  ?  Patient admitted on 08/26/21-08/31/21.  Patient's still having issues with balance, depth perception and having really bad diarrhea.  ? ? ?HPI: ? ?51 year old male with hypertension, history of alcohol abuse and now alcoholic cirrhosis, hyperlipidemia, family history of hemochromatosis presents for hospital follow-up. ? ?Patient's hospital course, discharge summary, labs, imaging were all reviewed. ? ?In summary: Patient presented with abdominal discomfort, dizziness, and diminished appetite.  Found to have cirrhosis.  Labs notable for increased LFTs as well as bilirubin.  Also notable for pancytopenia with marked thrombocytopenia.  Testing regarding hemochromatosis has returned negative.  There was concern for GI bleed given positive Hemoccult.  However, EGD was not done due to low platelet count.  Patient was discharged in stable condition. ? ?Patient has upcoming follow-up with GI tomorrow.  He states that he feels poorly.  He has had 1 fall.  He feels lightheaded.  He states that he is also having difficulty with ambulating and balance.  Also, he has had ongoing diarrhea presumably due to the lactulose that was prescribed.  Patient is most bothered by the amount of diarrhea that he is having.  He does not feel capable of working at this time.  I concur as I do not feel that it is safe for him to work at this point in time.  He has been abstinent from alcohol and states that he does not plan to resume. ? ?Patient Active Problem List  ? Diagnosis Date Noted  ? HLD (hyperlipidemia) 09/07/2021  ? Pancytopenia (HCC) 09/03/2021  ? Other encephalopathy   ? Heme positive stool   ? Alcoholic cirrhosis, unspecified whether ascites present (HCC) 08/26/2021  ? Heavy alcohol use 04/15/2020  ? Generalized anxiety disorder 09/25/2017  ? Essential  hypertension, benign 03/25/2013  ? ? ?Social Hx   ?Social History  ? ?Socioeconomic History  ? Marital status: Married  ?  Spouse name: Not on file  ? Number of children: Not on file  ? Years of education: Not on file  ? Highest education level: Not on file  ?Occupational History  ? Not on file  ?Tobacco Use  ? Smoking status: Every Day  ?  Packs/day: 1.00  ?  Types: Cigarettes  ? Smokeless tobacco: Never  ?Vaping Use  ? Vaping Use: Never used  ?Substance and Sexual Activity  ? Alcohol use: Yes  ?  Alcohol/week: 21.0 standard drinks  ?  Types: 21 Cans of beer per week  ?  Comment: Drinks 3-4 beers per day  ? Drug use: Never  ? Sexual activity: Not on file  ?Other Topics Concern  ? Not on file  ?Social History Narrative  ? Not on file  ? ?Social Determinants of Health  ? ?Financial Resource Strain: Not on file  ?Food Insecurity: Not on file  ?Transportation Needs: Not on file  ?Physical Activity: Not on file  ?Stress: Not on file  ?Social Connections: Not on file  ? ? ?Review of Systems ?Per HPI ? ?Objective:  ?BP 102/68   Pulse (!) 101   Temp 98.9 ?F (37.2 ?C) (Oral)   Ht 5\' 9"  (1.753 m)   Wt 206 lb 9.6 oz (93.7 kg)   SpO2 99%   BMI 30.51 kg/m?  ? ?BP/Weight 09/07/2021 08/31/2021 08/27/2021  ?Systolic BP 102 106 -  ?Diastolic  BP 68 58 -  ?Wt. (Lbs) 206.6 - 202.6  ?BMI 30.51 - -  ? ? ?Physical Exam ?Vitals and nursing note reviewed.  ?Constitutional:   ?   Comments: Appears older than stated age.  Jaundiced.  ?HENT:  ?   Head: Normocephalic and atraumatic.  ?Eyes:  ?   General: Scleral icterus present.  ?Cardiovascular:  ?   Rate and Rhythm: Regular rhythm. Tachycardia present.  ?Pulmonary:  ?   Effort: Pulmonary effort is normal.  ?   Breath sounds: Normal breath sounds. No wheezing, rhonchi or rales.  ?Abdominal:  ?   Palpations: Abdomen is soft.  ?   Tenderness: There is no abdominal tenderness.  ?Neurological:  ?   Mental Status: He is alert.  ?Psychiatric:     ?   Mood and Affect: Mood normal.     ?    Behavior: Behavior normal.  ? ? ?Lab Results  ?Component Value Date  ? WBC 5.0 09/07/2021  ? HGB 11.0 (L) 09/07/2021  ? HCT 33.4 (L) 09/07/2021  ? PLT 85 (L) 09/07/2021  ? GLUCOSE 125 (H) 09/07/2021  ? CHOL 151 08/26/2021  ? TRIG 149 08/26/2021  ? HDL 13 (L) 08/26/2021  ? LDLCALC 108 (H) 08/26/2021  ? ALT 85 (H) 09/07/2021  ? AST 173 (H) 09/07/2021  ? NA 136 09/07/2021  ? K 4.1 09/07/2021  ? CL 110 09/07/2021  ? CREATININE 0.74 09/07/2021  ? BUN 21 (H) 09/07/2021  ? CO2 20 (L) 09/07/2021  ? TSH 2.485 08/27/2021  ? INR 1.5 (H) 08/26/2021  ? ? ? ?Assessment & Plan:  ? ?Problem List Items Addressed This Visit   ? ?  ? Cardiovascular and Mediastinum  ? Essential hypertension, benign  ?  Soft pressure today.  Decreasing valsartan. ?  ?  ?  ? Digestive  ? Alcoholic cirrhosis, unspecified whether ascites present (HCC) - Primary  ?  Stat labs obtained today given patient's ongoing symptoms.  WBC, hemoglobin, and platelet count improved.  Renal function stable. ?Advise close follow-up with GI.  Patient adamant that he does not plan on drinking alcohol.  Patient needs to stay sober so that he can be considered for transplant in the future.  MELD score 19 (currently based on current labs and last INR). ?  ?  ? Relevant Orders  ? Comprehensive metabolic panel  ?  ? Hematopoietic and Hemostatic  ? Pancytopenia (HCC)  ? Relevant Orders  ? CBC  ? Ambulatory referral to Hematology / Oncology  ? ? ?Meds ordered this encounter  ?Medications  ? famotidine (PEPCID) 20 MG tablet  ?  Sig: Take 1 tablet (20 mg total) by mouth 2 (two) times daily.  ?  Dispense:  60 tablet  ?  Refill:  3  ? ? ?Follow-up:  2 weeks. ? ?Everlene Other DO ?[facility] ? ?

## 2021-09-07 NOTE — Assessment & Plan Note (Signed)
Soft pressure today.  Decreasing valsartan. ?

## 2021-09-07 NOTE — Patient Instructions (Addendum)
Labs at the hospital today. ? ?Urgent referral to Heme/Onc. ? ?Decrease the Valsartan and the Lactulose. ? ?Follow up in 2 weeks. ? ?Take care ? ?Dr. Adriana Simas  ?

## 2021-09-13 ENCOUNTER — Telehealth: Payer: Self-pay | Admitting: Family Medicine

## 2021-09-13 NOTE — Telephone Encounter (Signed)
Please advise. Thank you

## 2021-09-13 NOTE — Telephone Encounter (Signed)
Left message to return call 

## 2021-09-13 NOTE — Telephone Encounter (Signed)
Per Dr.Cook ?We can give him a work note for whatever he needs. He we supposed to have them fax or send [place] FMLA ?

## 2021-09-13 NOTE — Telephone Encounter (Signed)
Patient was seen 3/21 and he stated it was discussed at visit he would be out 2 to 3 months and will need a note and will be having FMLA faxed over to be completed also. He has seen GI doctor. Please advise on what dates for work note. ?

## 2021-09-14 NOTE — Telephone Encounter (Signed)
Patient states the provider told him he would be out 2-3 months or more and he not sure his return to work date ?

## 2021-09-14 NOTE — Telephone Encounter (Signed)
Jamie Sams, DO   ? ?Provide him a note for work please. And have him get [place] his FMLA and/or disability documents.   ? ?

## 2021-09-15 ENCOUNTER — Encounter: Payer: Self-pay | Admitting: Family Medicine

## 2021-09-15 ENCOUNTER — Telehealth: Payer: Self-pay | Admitting: Family Medicine

## 2021-09-15 NOTE — Telephone Encounter (Signed)
Patient dropped off FMLA to be completed in your box.  

## 2021-09-16 NOTE — Telephone Encounter (Signed)
Patient notified and stated he will have HR fax the correct FMLA form for completion by provider ?

## 2021-09-16 NOTE — Telephone Encounter (Signed)
See below

## 2021-09-16 NOTE — Telephone Encounter (Signed)
Left message to return call-form at nurses station ?

## 2021-09-21 ENCOUNTER — Ambulatory Visit (INDEPENDENT_AMBULATORY_CARE_PROVIDER_SITE_OTHER): Payer: 59 | Admitting: Family Medicine

## 2021-09-21 DIAGNOSIS — I10 Essential (primary) hypertension: Secondary | ICD-10-CM

## 2021-09-21 DIAGNOSIS — K7030 Alcoholic cirrhosis of liver without ascites: Secondary | ICD-10-CM | POA: Diagnosis not present

## 2021-09-21 DIAGNOSIS — G9349 Other encephalopathy: Secondary | ICD-10-CM | POA: Diagnosis not present

## 2021-09-21 MED ORDER — THIAMINE HCL 100 MG PO TABS: 100.0000 mg | ORAL_TABLET | Freq: Every day | ORAL | 1 refills | Status: AC

## 2021-09-21 MED ORDER — VALSARTAN 160 MG PO TABS: 160.0000 mg | ORAL_TABLET | Freq: Every day | ORAL | 1 refills | Status: DC

## 2021-09-21 MED ORDER — FOLIC ACID 1 MG PO TABS: 1.0000 mg | ORAL_TABLET | Freq: Every day | ORAL | 3 refills | Status: AC

## 2021-09-21 MED ORDER — BISMUTH SUBSALICYLATE 262 MG PO CHEW: 524.0000 mg | CHEWABLE_TABLET | ORAL | 0 refills | Status: AC | PRN

## 2021-09-21 MED ORDER — RIFAXIMIN 550 MG PO TABS: 550.0000 mg | ORAL_TABLET | Freq: Two times a day (BID) | ORAL | 0 refills | Status: DC

## 2021-09-21 MED ORDER — LACTULOSE ENCEPHALOPATHY 10 GM/15ML PO SOLN: 30.0000 g | Freq: Two times a day (BID) | ORAL | 3 refills | Status: AC

## 2021-09-21 NOTE — Assessment & Plan Note (Signed)
Patient to continue rifaximin and lactulose. ?

## 2021-09-21 NOTE — Progress Notes (Signed)
? ?Subjective:  ?Patient ID: Jamie Chavez, male    DOB: [birth year]  Age: 51 y.o. MRN: [id number] ? ?CC: ?Chief Complaint  ?Patient presents with  ? 2 Week Follow-up  ?  Cirrhosis and anemia- medication questions - none have refills - are they all to be cont.  ?  ? itching all over   ? trouble sleeping   ?  Taking melatonin , asks for recommendations  ? ? ?HPI: ? ?51 year old male with alcoholic cirrhosis presents for follow-up. ? ?Patient has recently seen GI.  He has upcoming follow-up with hematology. ? ?Patient reports that he continues to have loose stool from the lactulose.  He endorses compliance. ?He also states that he continues to have issues with balance and depth perception.  He needs me to fill out forms regarding FMLA/short-term disability.  He is not ready to go back to work. ? ?Blood pressures well controlled. ? ?He is no longer drinking. ? ?Patient states that he needs refills on his medications. ? ?He is sleeping okay.  He reports ongoing itching. ? ?Patient Active Problem List  ? Diagnosis Date Noted  ? HLD (hyperlipidemia) 09/07/2021  ? Pancytopenia (HCC) 09/03/2021  ? Other encephalopathy   ? Heme positive stool   ? Alcoholic cirrhosis, unspecified whether ascites present (HCC) 08/26/2021  ? Heavy alcohol use 04/15/2020  ? Generalized anxiety disorder 09/25/2017  ? Essential hypertension, benign 03/25/2013  ? ? ?Social Hx   ?Social History  ? ?Socioeconomic History  ? Marital status: Married  ?  Spouse name: Not on file  ? Number of children: Not on file  ? Years of education: Not on file  ? Highest education level: Not on file  ?Occupational History  ? Not on file  ?Tobacco Use  ? Smoking status: Every Day  ?  Packs/day: 1.00  ?  Types: Cigarettes  ? Smokeless tobacco: Never  ?Vaping Use  ? Vaping Use: Never used  ?Substance and Sexual Activity  ? Alcohol use: Yes  ?  Alcohol/week: 21.0 standard drinks  ?  Types: 21 Cans of beer per week  ?  Comment: Drinks 3-4 beers per day  ? Drug use: Never  ?  Sexual activity: Not on file  ?Other Topics Concern  ? Not on file  ?Social History Narrative  ? Not on file  ? ?Social Determinants of Health  ? ?Financial Resource Strain: Not on file  ?Food Insecurity: Not on file  ?Transportation Needs: Not on file  ?Physical Activity: Not on file  ?Stress: Not on file  ?Social Connections: Not on file  ? ? ?Review of Systems ?Per HPI ? ?Objective:  ?BP 107/70   Pulse 96   Temp 98.4 ?F (36.9 ?C)   Ht 5\' 9"  (1.753 m)   Wt 202 lb (91.6 kg)   SpO2 97%   BMI 29.83 kg/m?  ? ? ?  09/21/2021  ? 10:34 AM 09/07/2021  ? 10:48 AM 08/31/2021  ? 11:41 AM  ?BP/Weight  ?Systolic BP 107 102 106  ?Diastolic BP 70 68 58  ?Wt. (Lbs) 202 206.6   ?BMI 29.83 kg/m2 30.51 kg/m2   ? ? ?Physical Exam ?Vitals and nursing note reviewed.  ?Constitutional:   ?   General: He is not in acute distress. ?   Appearance: Normal appearance. He is not ill-appearing.  ?HENT:  ?   Head: Normocephalic and atraumatic.  ?Cardiovascular:  ?   Rate and Rhythm: Normal rate and regular rhythm.  ?Pulmonary:  ?  Effort: Pulmonary effort is normal.  ?   Breath sounds: Normal breath sounds. No wheezing or rales.  ?Abdominal:  ?   Palpations: Abdomen is soft.  ?   Tenderness: There is no abdominal tenderness.  ?Neurological:  ?   Mental Status: He is alert.  ?Psychiatric:     ?   Mood and Affect: Mood normal.     ?   Behavior: Behavior normal.  ? ? ?Lab Results  ?Component Value Date  ? WBC 5.0 09/07/2021  ? HGB 11.0 (L) 09/07/2021  ? HCT 33.4 (L) 09/07/2021  ? PLT 85 (L) 09/07/2021  ? GLUCOSE 125 (H) 09/07/2021  ? CHOL 151 08/26/2021  ? TRIG 149 08/26/2021  ? HDL 13 (L) 08/26/2021  ? LDLCALC 108 (H) 08/26/2021  ? ALT 85 (H) 09/07/2021  ? AST 173 (H) 09/07/2021  ? NA 136 09/07/2021  ? K 4.1 09/07/2021  ? CL 110 09/07/2021  ? CREATININE 0.74 09/07/2021  ? BUN 21 (H) 09/07/2021  ? CO2 20 (L) 09/07/2021  ? TSH 2.485 08/27/2021  ? INR 1.5 (H) 08/26/2021  ? ? ? ?Assessment & Plan:  ? ?Problem List Items Addressed This Visit   ? ?   ? Cardiovascular and Mediastinum  ? Essential hypertension, benign  ?  Stable.  Continue valsartan. ?  ?  ? Relevant Medications  ? valsartan (DIOVAN) 160 MG tablet  ?  ? Digestive  ? Alcoholic cirrhosis, unspecified whether ascites present (HCC)  ?  Forms filled out regarding FMLA/short-term disability. ?Patient appears to be improving.  Needs close GI follow-up. ?  ?  ?  ? Nervous and Auditory  ? Other encephalopathy  ?  Patient to continue rifaximin and lactulose. ?  ?  ? ? ?Meds ordered this encounter  ?Medications  ? bismuth subsalicylate (PEPTO BISMOL) 262 MG chewable tablet  ?  Sig: Chew 2 tablets (524 mg total) by mouth as needed for indigestion.  ?  Dispense:  30 tablet  ?  Refill:  0  ? folic acid (FOLVITE) 1 MG tablet  ?  Sig: Take 1 tablet (1 mg total) by mouth daily.  ?  Dispense:  90 tablet  ?  Refill:  3  ? lactulose, encephalopathy, (CHRONULAC) 10 GM/15ML SOLN  ?  Sig: Take 45 mLs (30 g total) by mouth 2 (two) times daily.  ?  Dispense:  1892 mL  ?  Refill:  3  ? rifaximin (XIFAXAN) 550 MG TABS tablet  ?  Sig: Take 1 tablet (550 mg total) by mouth 2 (two) times daily.  ?  Dispense:  180 tablet  ?  Refill:  0  ? thiamine 100 MG tablet  ?  Sig: Take 1 tablet (100 mg total) by mouth daily.  ?  Dispense:  90 tablet  ?  Refill:  1  ? valsartan (DIOVAN) 160 MG tablet  ?  Sig: Take 1 tablet (160 mg total) by mouth daily.  ?  Dispense:  90 tablet  ?  Refill:  1  ? ? ?Follow-up:  Return in about 1 month (around 10/21/2021). ? ?Everlene Other DO ?[facility] ? ?

## 2021-09-21 NOTE — Patient Instructions (Signed)
I am going to refill the medications. ? ?Follow up in 1 month. ? ?Take care ? ?Dr. Adriana Simas  ?

## 2021-09-21 NOTE — Assessment & Plan Note (Signed)
Forms filled out regarding FMLA/short-term disability. ?Patient appears to be improving.  Needs close GI follow-up. ?

## 2021-09-21 NOTE — Assessment & Plan Note (Addendum)
Stable.  Continue valsartan. 

## 2021-09-30 ENCOUNTER — Encounter (HOSPITAL_COMMUNITY): Payer: 59 | Admitting: Hematology

## 2021-10-01 ENCOUNTER — Inpatient Hospital Stay: Payer: 59 | Attending: Hematology | Admitting: Hematology

## 2021-10-01 ENCOUNTER — Other Ambulatory Visit: Payer: Self-pay

## 2021-10-01 VITALS — BP 116/70 | HR 96 | Temp 98.7°F | Resp 18 | Ht 69.0 in | Wt 201.1 lb

## 2021-10-01 DIAGNOSIS — K7010 Alcoholic hepatitis without ascites: Secondary | ICD-10-CM | POA: Diagnosis not present

## 2021-10-01 DIAGNOSIS — Z79899 Other long term (current) drug therapy: Secondary | ICD-10-CM | POA: Diagnosis not present

## 2021-10-01 DIAGNOSIS — D61818 Other pancytopenia: Secondary | ICD-10-CM | POA: Diagnosis not present

## 2021-10-01 DIAGNOSIS — D539 Nutritional anemia, unspecified: Secondary | ICD-10-CM | POA: Insufficient documentation

## 2021-10-01 DIAGNOSIS — E538 Deficiency of other specified B group vitamins: Secondary | ICD-10-CM | POA: Insufficient documentation

## 2021-10-01 DIAGNOSIS — K7460 Unspecified cirrhosis of liver: Secondary | ICD-10-CM | POA: Insufficient documentation

## 2021-10-01 DIAGNOSIS — F1010 Alcohol abuse, uncomplicated: Secondary | ICD-10-CM | POA: Insufficient documentation

## 2021-10-01 DIAGNOSIS — I10 Essential (primary) hypertension: Secondary | ICD-10-CM | POA: Insufficient documentation

## 2021-10-01 NOTE — Progress Notes (Signed)
?[facility]   ?[phone] [phone]   ?Clinic Follow up Note  ? ?Patient Care Team: ?[name], DO as PCP - General (Family Medicine) ? ?Date of Service:  10/01/2021 ? ?CHIEF COMPLAINT: f/u of anemia and thrombocytopenia ? ?CURRENT THERAPY:  ?Observation ? ?ASSESSMENT & PLAN:  ?Jamie Chavez is a 51 y.o. male with  ? ?1. Macrocytic Anemia and Thrombocytopenia, Folate deficiency ?-likely secondary to liver cirrhosis and alcohol abuse. ?-presented with dizziness, low appetite, and abdominal discomfort on 08/26/21 and was hospitalized.  He was found to have liver cirrhosis, folate deficiency, moderate macrocytic anemia, and thrombocytopenia with platelet in 20-30K, no evidence of hemolysis.  This is felt to be related to his liver cirrhosis and alcohol abuse.  ?-He has stopped drinking alcohol since his recent hospital stay.  Lab reviewed, his anemia and thrombocytopenia has much improved, hemoglobin 11, platelet 85 K on September 07, 2021. ?-Continue folic acid ?-he has a family history of hemochromatosis in his sister. His hemochromatosis DNA mutation analysis was negative. ? ?2. Alcohol Abuse, Liver Cirrhosis, Alcoholic Hepatitis ?-tbili 6.7 upon admission on 08/26/21 ?-abdomen MRI on 08/30/21 confirmed cirrhosis, steatosis, hepatosplenomegaly, and portal venous hypertension. No evidence of carcinoma or hemochromatosis. ?-He has appointment with GI in next few weeks. ? ? ?PLAN: ?-Lab reviewed, anemia and thrombocytopenia has improved, continue observation.  No additional treatment is needed. ?-Follow-up with PCP for CBC every 4 to 6 months. ?-phone visit in 1 year ? ? ?No problem-specific Assessment & Plan notes found for this encounter. ? ? ?INTERVAL HISTORY:  ?Jamie Chavez is here for a follow up of anemia, thrombocytopenia. He was last seen by me on 08/31/21 while he was in the hospital. He presents to the clinic accompanied by his daughter. ?He reports he is improving since  discharge. He notes he is abstaining from alcohol now. ?  ?All other systems were reviewed with the patient and are negative. ? ?MEDICAL HISTORY:  ?Past Medical History:  ?Diagnosis Date  ? ETOH abuse   ? Drinks 23-4 beers + 5-6 shots of liquor per day  ? GERD (gastroesophageal reflux disease)   ? Hypertension   ? ? ?SURGICAL HISTORY: ?Past Surgical History:  ?Procedure Laterality Date  ? ARTHROSCOPIC REPAIR ACL    ? SHOULDER ARTHROSCOPY    ? ? ?I have reviewed the social history and family history with the patient and they are unchanged from previous note. ? ?ALLERGIES:  has No Known Allergies. ? ?MEDICATIONS:  ?Current Outpatient Medications  ?Medication Sig Dispense Refill  ? bismuth subsalicylate (PEPTO BISMOL) 262 MG chewable tablet Chew 2 tablets (524 mg total) by mouth as needed for indigestion. 30 tablet 0  ? famotidine (PEPCID) 20 MG tablet Take 1 tablet (20 mg total) by mouth 2 (two) times daily. 60 tablet 3  ? folic acid (FOLVITE) 1 MG tablet Take 1 tablet (1 mg total) by mouth daily. 90 tablet 3  ? lactulose, encephalopathy, (CHRONULAC) 10 GM/15ML SOLN Take 45 mLs (30 g total) by mouth 2 (two) times daily. 1892 mL 3  ? Melatonin 10 MG TABS Take by mouth.    ? rifaximin (XIFAXAN) 550 MG TABS tablet Take 1 tablet (550 mg total) by mouth 2 (two) times daily. 180 tablet 0  ? thiamine 100 MG tablet Take 1 tablet (100 mg total) by mouth daily. 90 tablet 1  ? valsartan (DIOVAN) 160 MG tablet Take 1 tablet (160 mg total) by mouth daily. 90 tablet 1  ? ?  No current facility-administered medications for this visit.  ? ? ?PHYSICAL EXAMINATION: ?ECOG PERFORMANCE STATUS: 1 - Symptomatic but completely ambulatory ? ?Vitals:  ? 10/01/21 1545  ?BP: 116/70  ?Pulse: 96  ?Resp: 18  ?Temp: 98.7 ?F (37.1 ?C)  ?SpO2: 100%  ? ?Wt Readings from Last 3 Encounters:  ?10/01/21 201 lb 1.6 oz (91.2 kg)  ?09/21/21 202 lb (91.6 kg)  ?09/07/21 206 lb 9.6 oz (93.7 kg)  ?  ? ?GENERAL:alert, no distress and comfortable ?SKIN: skin color  normal, no rashes or significant lesions ?EYES: normal, Conjunctiva are pink and non-injected, sclera clear  ?NEURO: alert & oriented x 3 with fluent speech ? ?LABORATORY DATA:  ?I have reviewed the data as listed ? ?  Latest Ref Rng & Units 09/07/2021  ? 12:21 PM 08/31/2021  ?  5:46 AM 08/30/2021  ?  2:28 AM  ?CBC  ?WBC 4.0 - 10.5 K/uL 5.0   3.3   3.1    ?Hemoglobin 13.0 - 17.0 g/dL 74.1   9.0   9.5    ?Hematocrit 39.0 - 52.0 % 33.4   25.5   26.0    ?Platelets 150 - 400 K/uL 85   46   35    ? ? ? ? ?  Latest Ref Rng & Units 09/07/2021  ? 12:21 PM 08/30/2021  ?  1:08 AM 08/29/2021  ?  5:54 AM  ?CMP  ?Glucose 70 - 99 mg/dL 287   867   672    ?BUN 6 - 20 mg/dL 21   13   13     ?Creatinine 0.61 - 1.24 mg/dL   0.94   7.09    ?Sodium 135 - 145 mmol/L 136   140   134    ?Potassium 3.5 - 5.1 mmol/L 4.1   4.6   3.5    ?Chloride 98 - 111 mmol/L 110   108   103    ?CO2 22 - 32 mmol/L 20   24   22     ?Calcium 8.9 - 10.3 mg/dL 9.1   8.6   8.4    ?Total Protein 6.5 - 8.1 g/dL 7.5    5.9    ?Total Bilirubin 0.3 - 1.2 mg/dL 6.2    7.3    ?Alkaline Phos 38 - 126 U/L 80    69    ?AST 15 - 41 U/L 173    178    ?ALT 0 - 44 U/L 85    69    ? ? ? ? ?RADIOGRAPHIC STUDIES: ?I have personally reviewed the radiological images as listed and agreed with the findings in the report. ?No results found.  ? ? ?No orders of the defined types were placed in this encounter. ? ?All questions were answered. The patient knows to call the clinic with any problems, questions or concerns. No barriers to learning was detected. ?The total time spent in the appointment was 25 minutes. ? ?  ? 6.28, MD ?10/01/2021  ? ?I, Malachy Mood, am acting as scribe for 10/03/2021, MD.  ? ?I have reviewed the above documentation for accuracy and completeness, and I agree with the above. ?  ? ? ?

## 2021-10-02 ENCOUNTER — Encounter: Payer: Self-pay | Admitting: Hematology

## 2021-10-21 ENCOUNTER — Ambulatory Visit (INDEPENDENT_AMBULATORY_CARE_PROVIDER_SITE_OTHER): Payer: 59 | Admitting: Family Medicine

## 2021-10-21 VITALS — BP 111/74 | HR 84 | Temp 98.8°F | Ht 69.0 in | Wt 211.2 lb

## 2021-10-21 DIAGNOSIS — K7030 Alcoholic cirrhosis of liver without ascites: Secondary | ICD-10-CM

## 2021-10-21 DIAGNOSIS — D539 Nutritional anemia, unspecified: Secondary | ICD-10-CM

## 2021-10-21 DIAGNOSIS — K7031 Alcoholic cirrhosis of liver with ascites: Secondary | ICD-10-CM | POA: Diagnosis not present

## 2021-10-21 DIAGNOSIS — I10 Essential (primary) hypertension: Secondary | ICD-10-CM

## 2021-10-21 DIAGNOSIS — D696 Thrombocytopenia, unspecified: Secondary | ICD-10-CM

## 2021-10-21 NOTE — Patient Instructions (Signed)
Stop the valsartan. ? ?Labs today. ? ?We are arranging ultrasound. ? ?Follow-up in 2 weeks for repeat evaluation and repeat labs.  Please keep a close eye on your blood pressures. ? ?Take care ? ?Dr. Adriana Simas  ?

## 2021-10-22 LAB — CMP14+EGFR
ALT: 30 IU/L (ref 0–44)
AST: 70 IU/L — ABNORMAL HIGH (ref 0–40)
Albumin/Globulin Ratio: 0.9 — ABNORMAL LOW (ref 1.2–2.2)
Albumin: 3.3 g/dL — ABNORMAL LOW (ref 4.0–5.0)
Alkaline Phosphatase: 82 IU/L (ref 44–121)
BUN/Creatinine Ratio: 14 (ref 9–20)
BUN: 9 mg/dL (ref 6–24)
Bilirubin Total: 3.2 mg/dL — ABNORMAL HIGH (ref 0.0–1.2)
CO2: 21 mmol/L (ref 20–29)
Calcium: 9.1 mg/dL (ref 8.7–10.2)
Chloride: 107 mmol/L — ABNORMAL HIGH (ref 96–106)
Creatinine, Ser: 0.63 mg/dL — ABNORMAL LOW (ref 0.76–1.27)
Globulin, Total: 3.7 g/dL (ref 1.5–4.5)
Glucose: 111 mg/dL — ABNORMAL HIGH (ref 70–99)
Potassium: 3.9 mmol/L (ref 3.5–5.2)
Sodium: 143 mmol/L (ref 134–144)
Total Protein: 7 g/dL (ref 6.0–8.5)
eGFR: 117 mL/min/{1.73_m2} (ref >59)

## 2021-10-22 LAB — CBC
Hematocrit: 32.8 % — ABNORMAL LOW (ref 37.5–51.0)
Hemoglobin: 11.5 g/dL — ABNORMAL LOW (ref 13.0–17.7)
MCH: 34.3 pg — ABNORMAL HIGH (ref 26.6–33.0)
MCHC: 35.1 g/dL (ref 31.5–35.7)
MCV: 98 fL — ABNORMAL HIGH (ref 79–97)
Platelets: 63 10*3/uL — CL (ref 150–450)
RBC: 3.35 x10E6/uL — ABNORMAL LOW (ref 4.14–5.80)
RDW: 12.5 % (ref 11.6–15.4)
WBC: 4.5 10*3/uL (ref 3.4–10.8)

## 2021-10-24 NOTE — Progress Notes (Signed)
? ?Subjective:  ?Patient ID: Jamie Chavez, male    DOB: 02-20-1971  Age: 51 y.o. MRN: [id number] ? ?CC: ?Chief Complaint  ?Patient presents with  ? Follow-up  ?  1 month   ? ? ?HPI: ? ?51 year old male with alcoholic cirrhosis, hypertension, anemia, thrombocytopenia presents for follow up. ? ?Started on spironolactone and lasix by GI. Has not yet started medications. ? ?Reports abdominal distension and weight gain. ? ?BP has been well controlled. Low normal at times. Patient concerned about hypotension with addition of new meds (I am concerned as well). Currently on Valsartan.  ? ?Patient Active Problem List  ? Diagnosis Date Noted  ? Macrocytic anemia 10/21/2021  ? Thrombocytopenia ([place]) 10/21/2021  ? HLD (hyperlipidemia) 09/07/2021  ? Other encephalopathy   ? Alcoholic cirrhosis, unspecified whether ascites present ([place]) 08/26/2021  ? Heavy alcohol use 04/15/2020  ? Generalized anxiety disorder 09/25/2017  ? Essential hypertension, benign 03/25/2013  ? ? ?Social Hx   ?Social History  ? ?Socioeconomic History  ? Marital status: Married  ?  Spouse name: Not on file  ? Number of children: Not on file  ? Years of education: Not on file  ? Highest education level: Not on file  ?Occupational History  ? Not on file  ?Tobacco Use  ? Smoking status: Every Day  ?  Packs/day: 1.00  ?  Types: Cigarettes  ? Smokeless tobacco: Never  ?Vaping Use  ? Vaping Use: Never used  ?Substance and Sexual Activity  ? Alcohol use: Yes  ?  Alcohol/week: 21.0 standard drinks  ?  Types: 21 Cans of beer per week  ?  Comment: Drinks 3-4 beers per day  ? Drug use: Never  ? Sexual activity: Not on file  ?Other Topics Concern  ? Not on file  ?Social History Narrative  ? Not on file  ? ?Social Determinants of Health  ? ?Financial Resource Strain: Not on file  ?Food Insecurity: Not on file  ?Transportation Needs: Not on file  ?Physical Activity: Not on file  ?Stress: Not on file  ?Social Connections: Not on file  ? ? ?Review of Systems   ?Constitutional:  Positive for unexpected weight change.  ?Gastrointestinal:  Positive for abdominal distention.  ? ?Objective:  ?BP 111/74   Pulse 84   Temp 98.8 ?F (37.1 ?C) (Oral)   Ht '5\' 9"'  (1.753 m)   Wt 211 lb 3.2 oz (95.8 kg)   SpO2 97%   BMI 31.19 kg/m?  ? ? ?  10/21/2021  ? 10:33 AM 10/01/2021  ?  3:45 PM 09/21/2021  ? 10:34 AM  ?BP/Weight  ?Systolic BP 630 160 109  ?Diastolic BP 74 70 70  ?Wt. (Lbs) 211.2 201.1 202  ?BMI 31.19 kg/m2 29.7 kg/m2 29.83 kg/m2  ? ? ?Physical Exam ?Constitutional:   ?   General: He is not in acute distress. ?   Appearance: Normal appearance.  ?HENT:  ?   Head: Normocephalic and atraumatic.  ?Eyes:  ?   General:     ?   Right eye: No discharge.     ?   Left eye: No discharge.  ?   Conjunctiva/sclera: Conjunctivae normal.  ?Cardiovascular:  ?   Rate and Rhythm: Normal rate and regular rhythm.  ?Pulmonary:  ?   Effort: Pulmonary effort is normal. No respiratory distress.  ?   Breath sounds: Normal breath sounds.  ?Abdominal:  ?   General: There is distension.  ?   Tenderness: There is  no abdominal tenderness.  ?Neurological:  ?   Mental Status: He is alert.  ?Psychiatric:     ?   Mood and Affect: Mood normal.     ?   Behavior: Behavior normal.  ? ? ?Lab Results  ?Component Value Date  ? WBC 4.5 10/21/2021  ? HGB 11.5 (L) 10/21/2021  ? HCT 32.8 (L) 10/21/2021  ? PLT 63 (LL) 10/21/2021  ? GLUCOSE 111 (H) 10/21/2021  ? CHOL 151 08/26/2021  ? TRIG 149 08/26/2021  ? HDL 13 (L) 08/26/2021  ? LDLCALC 108 (H) 08/26/2021  ? ALT 30 10/21/2021  ? AST 70 (H) 10/21/2021  ? NA 143 10/21/2021  ? K 3.9 10/21/2021  ? CL 107 (H) 10/21/2021  ? CREATININE 0.63 (L) 10/21/2021  ? BUN 9 10/21/2021  ? CO2 21 10/21/2021  ? TSH 2.485 08/27/2021  ? INR 1.5 (H) 08/26/2021  ? ? ? ?Assessment & Plan:  ? ?Problem List Items Addressed This Visit   ? ?  ? Cardiovascular and Mediastinum  ? Essential hypertension, benign  ?  Stopping Valsartan. Recommend starting Lasix and Spironolactone by GI. Labs today and  metabolic panel in 2 weeks. ? ?  ?  ? Relevant Medications  ? furosemide (LASIX) 40 MG tablet  ? spironolactone (ALDACTONE) 100 MG tablet  ?  ? Digestive  ? Alcoholic cirrhosis, unspecified whether ascites present ([place]) - Primary  ?  [place] ordered to assess for ascites. ? ?  ?  ? Relevant Orders  ? CMP14+EGFR (Completed)  ?  ? Hematopoietic and Hemostatic  ? Thrombocytopenia ([place])  ?  Continues to persist. Will continue to monitor closely.  ? ?  ?  ?  ? Other  ? Macrocytic anemia  ?  Stable.  ? ?  ?  ? Relevant Orders  ? CBC (Completed)  ? ?Other Visit Diagnoses   ? ? Ascites due to alcoholic cirrhosis ([facility])      ? Relevant Orders  ? US Abdomen Complete  ? ?  ? ?Follow-up:  Return in about 2 weeks (around 11/04/2021). ? ?Thersa Salt DO ?[place] ? ?

## 2021-10-24 NOTE — Assessment & Plan Note (Signed)
Continues to persist. Will continue to monitor closely.  ?

## 2021-10-24 NOTE — Assessment & Plan Note (Signed)
[place] ordered to assess for ascites. ?

## 2021-10-24 NOTE — Assessment & Plan Note (Signed)
Stopping Valsartan. Recommend starting Lasix and Spironolactone by GI. Labs today and metabolic panel in 2 weeks. ?

## 2021-10-24 NOTE — Assessment & Plan Note (Signed)
Stable

## 2021-11-04 ENCOUNTER — Ambulatory Visit: Payer: 59 | Admitting: Family Medicine

## 2021-11-10 ENCOUNTER — Ambulatory Visit (HOSPITAL_COMMUNITY): Admission: RE | Admit: 2021-11-10 | Payer: 59 | Source: Ambulatory Visit

## 2021-11-11 ENCOUNTER — Ambulatory Visit (INDEPENDENT_AMBULATORY_CARE_PROVIDER_SITE_OTHER): Payer: 59 | Admitting: Family Medicine

## 2021-11-11 ENCOUNTER — Encounter: Payer: Self-pay | Admitting: Family Medicine

## 2021-11-11 ENCOUNTER — Ambulatory Visit (HOSPITAL_COMMUNITY)
Admission: RE | Admit: 2021-11-11 | Discharge: 2021-11-11 | Disposition: A | Discharge status: Home / Self Care | Payer: 59 | Source: Ambulatory Visit | Attending: Family Medicine | Admitting: Family Medicine

## 2021-11-11 VITALS — BP 137/84 | HR 66 | Temp 99.4°F | Ht 69.0 in | Wt 187.2 lb

## 2021-11-11 DIAGNOSIS — R7309 Other abnormal glucose: Secondary | ICD-10-CM

## 2021-11-11 DIAGNOSIS — K7030 Alcoholic cirrhosis of liver without ascites: Secondary | ICD-10-CM | POA: Diagnosis present

## 2021-11-11 DIAGNOSIS — R161 Splenomegaly, not elsewhere classified: Secondary | ICD-10-CM | POA: Insufficient documentation

## 2021-11-11 NOTE — Patient Instructions (Signed)
Labs today.  Arranging [place] again.  Follow up in 3 months.  Take care  Dr. Adriana Simas

## 2021-11-12 LAB — CBC
Hematocrit: 34.6 % — ABNORMAL LOW (ref 37.5–51.0)
Hemoglobin: 12.4 g/dL — ABNORMAL LOW (ref 13.0–17.7)
MCH: 33.6 pg — ABNORMAL HIGH (ref 26.6–33.0)
MCHC: 35.8 g/dL — ABNORMAL HIGH (ref 31.5–35.7)
MCV: 94 fL (ref 79–97)
Platelets: 82 10*3/uL — CL (ref 150–450)
RBC: 3.69 x10E6/uL — ABNORMAL LOW (ref 4.14–5.80)
RDW: 13.5 % (ref 11.6–15.4)
WBC: 4.4 10*3/uL (ref 3.4–10.8)

## 2021-11-12 LAB — CMP14+EGFR
ALT: 34 IU/L (ref 0–44)
AST: 69 IU/L — ABNORMAL HIGH (ref 0–40)
Albumin/Globulin Ratio: 0.9 — ABNORMAL LOW (ref 1.2–2.2)
Albumin: 3.8 g/dL — ABNORMAL LOW (ref 4.0–5.0)
Alkaline Phosphatase: 78 IU/L (ref 44–121)
BUN/Creatinine Ratio: 11 (ref 9–20)
BUN: 8 mg/dL (ref 6–24)
Bilirubin Total: 3.6 mg/dL — ABNORMAL HIGH (ref 0.0–1.2)
CO2: 23 mmol/L (ref 20–29)
Calcium: 9.4 mg/dL (ref 8.7–10.2)
Chloride: 98 mmol/L (ref 96–106)
Creatinine, Ser: 0.74 mg/dL — ABNORMAL LOW (ref 0.76–1.27)
Globulin, Total: 4.2 g/dL (ref 1.5–4.5)
Glucose: 127 mg/dL — ABNORMAL HIGH (ref 70–99)
Potassium: 3.7 mmol/L (ref 3.5–5.2)
Sodium: 138 mmol/L (ref 134–144)
Total Protein: 8 g/dL (ref 6.0–8.5)
eGFR: 111 mL/min/{1.73_m2} (ref >59)

## 2021-11-12 LAB — HEMOGLOBIN A1C
Est. average glucose Bld gHb Est-mCnc: 94 mg/dL
Hgb A1c MFr Bld: 4.9 % (ref 4.8–5.6)

## 2021-11-15 NOTE — Assessment & Plan Note (Signed)
Labs done. Bili and LFT's stable. US obtained and revealed splenomegaly and moderate ascites. Continue Lasix and Spironolactone. Continue close follow up with GI.

## 2021-11-15 NOTE — Progress Notes (Signed)
Subjective:  Patient ID: Jamie Chavez, male    DOB: [birth year]  Age: 51 y.o. MRN: [id number]  CC: Chief Complaint  Patient presents with   2 Week Follow-up    HPI:  51 year old male with HTN, Alcoholic cirrhosis with ascites, splenomegaly, thrombocytopenia, anemia, HLD presents for follow up.   Patient did not get his [place] (he states he was unaware). He reports urinating frequently due to lasix. Still has lots of loose stools due to lactulose use.   Abdomen still distended but soft. Blood pressures have been stable.   Patient Active Problem List   Diagnosis Date Noted   Splenomegaly 11/11/2021   Macrocytic anemia 10/21/2021   Thrombocytopenia (H[facility]) 10/21/2021   HLD (hyperlipidemia) 09/07/2021   Other encephalopathy    Alcoholic cirrhosis of liver with ascites (HCC) 08/26/2021   Heavy alcohol use 04/15/2020   Generalized anxiety disorder 09/25/2017   Essential hypertension, benign 03/25/2013    Social Hx   Social History   Socioeconomic History   Marital status: Married    Spouse name: Not on file   Number of children: Not on file   Years of education: Not on file   Highest education level: Not on file  Occupational History   Not on file  Tobacco Use   Smoking status: Every Day    Packs/day: 1.00    Types: Cigarettes   Smokeless tobacco: Never  Vaping Use   Vaping Use: Never used  Substance and Sexual Activity   Alcohol use: Yes    Alcohol/week: 21.0 standard drinks    Types: 21 Cans of beer per week    Comment: Drinks 3-4 beers per day   Drug use: Never   Sexual activity: Not on file  Other Topics Concern   Not on file  Social History Narrative   Not on file   Social Determinants of Health   Financial Resource Strain: Not on file  Food Insecurity: Not on file  Transportation Needs: Not on file  Physical Activity: Not on file  Stress: Not on file  Social Connections: Not on file    Review of Systems Per HPI  Objective:  BP 137/84   Pulse 66    Temp 99.4 F (37.4 C) (Oral)   Ht 5\' 9"  (1.753 m)   Wt 187 lb 3.2 oz (84.9 kg)   SpO2 96%   BMI 27.64 kg/m      11/11/2021    1:50 PM 10/21/2021   10:33 AM 10/01/2021    3:45 PM  BP/Weight  Systolic BP 137 111 116  Diastolic BP 84 74 70  Wt. (Lbs) 187.2 211.2 201.1  BMI 27.64 kg/m2 31.19 kg/m2 29.7 kg/m2    Physical Exam  Lab Results  Component Value Date   WBC 4.4 11/11/2021   HGB 12.4 (L) 11/11/2021   HCT 34.6 (L) 11/11/2021   PLT 82 (LL) 11/11/2021   GLUCOSE 127 (H) 11/11/2021   CHOL 151 08/26/2021   TRIG 149 08/26/2021   HDL 13 (L) 08/26/2021   LDLCALC 108 (H) 08/26/2021   ALT 34 11/11/2021   AST 69 (H) 11/11/2021   NA 138 11/11/2021   K 3.7 11/11/2021   CL 98 11/11/2021   CREATININE 0.74 (L) 11/11/2021   BUN 8 11/11/2021   CO2 23 11/11/2021   TSH 2.485 08/27/2021   INR 1.5 (H) 08/26/2021   HGBA1C 4.9 11/11/2021     Assessment & Plan:   Problem List Items Addressed This Visit  Digestive   Alcoholic cirrhosis of liver with ascites (HCC) - Primary    Labs done. Bili and LFT's stable. US obtained and revealed splenomegaly and moderate ascites. Continue Lasix and Spironolactone. Continue close follow up with GI.       Other Visit Diagnoses     Elevated random blood glucose level       Relevant Orders   HgB A1c (Completed)       Follow-up:  Return in about 3 months (around 02/11/2022).  Everlene Other DO [facility]

## 2021-11-29 ENCOUNTER — Encounter: Payer: Self-pay | Admitting: *Deleted

## 2022-01-15 ENCOUNTER — Other Ambulatory Visit: Payer: Self-pay | Admitting: Family Medicine

## 2022-01-22 ENCOUNTER — Other Ambulatory Visit: Payer: Self-pay | Admitting: Family Medicine

## 2022-02-11 ENCOUNTER — Ambulatory Visit: Payer: 59 | Admitting: Family Medicine

## 2022-02-18 ENCOUNTER — Other Ambulatory Visit: Payer: Self-pay | Admitting: Family Medicine

## 2022-03-13 ENCOUNTER — Other Ambulatory Visit: Payer: Self-pay | Admitting: Family Medicine

## 2022-04-09 ENCOUNTER — Other Ambulatory Visit: Payer: Self-pay | Admitting: Family Medicine

## 2022-05-21 ENCOUNTER — Other Ambulatory Visit: Payer: Self-pay | Admitting: Family Medicine

## 2022-07-04 ENCOUNTER — Other Ambulatory Visit: Payer: Self-pay | Admitting: Family Medicine

## 2022-08-03 ENCOUNTER — Other Ambulatory Visit: Payer: Self-pay | Admitting: Family Medicine

## 2022-08-11 IMAGING — US US ABDOMEN COMPLETE
1 series · 13 of 25 positions shown · non-contrast
Comparison: [DATE] [DATE], [DATE].  [DATE] [DATE], [DATE].

CLINICAL DATA: Hepatic cirrhosis, ascites.

EXAM:
ABDOMEN ULTRASOUND COMPLETE

[Series 1: us abdomen complete · 13 of 101 slices shown]
[im 1/101]
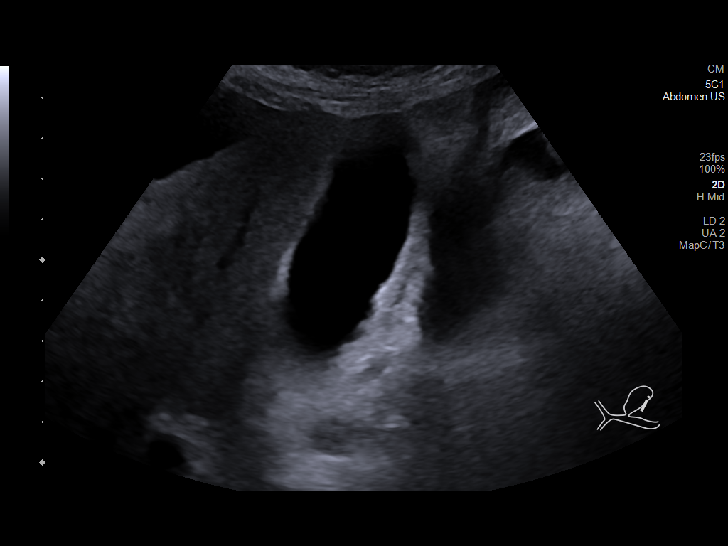
[im 9/101]
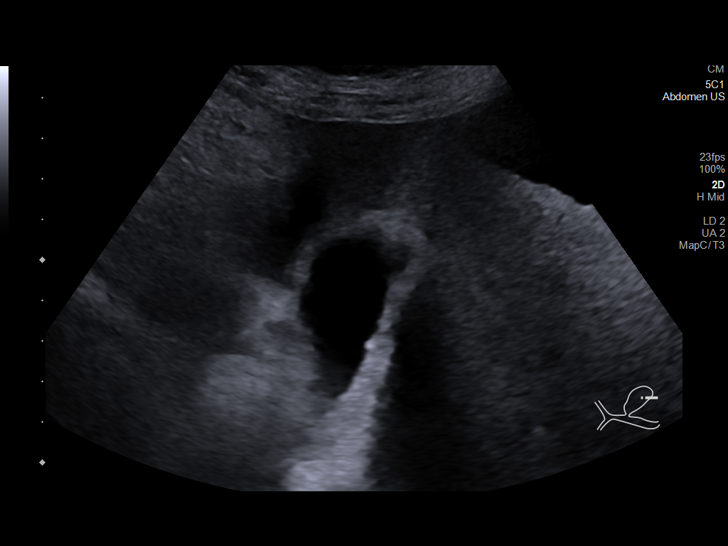
[im 17/101]
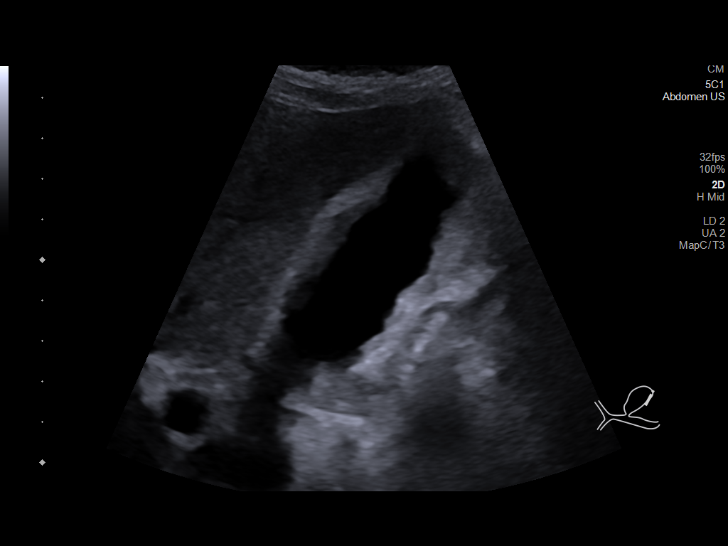
[im 26/101]
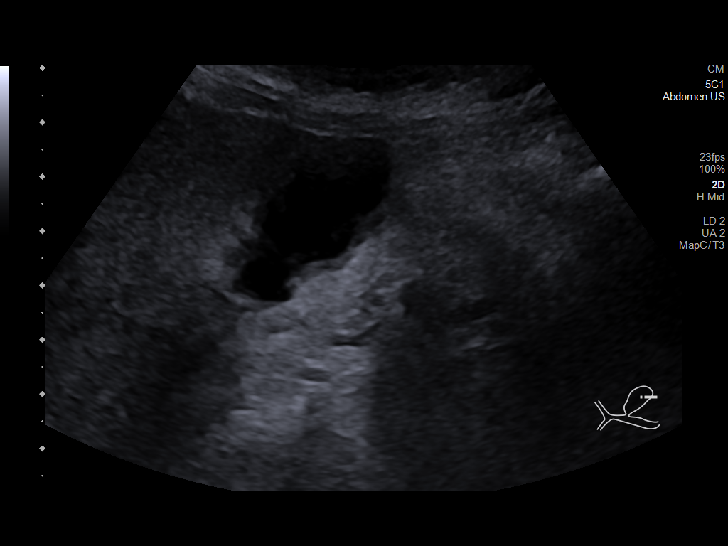
[im 34/101]
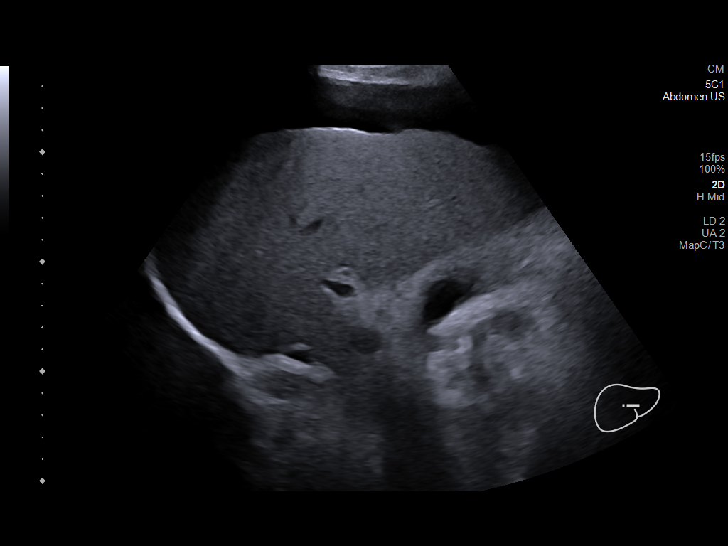
[im 42/101]
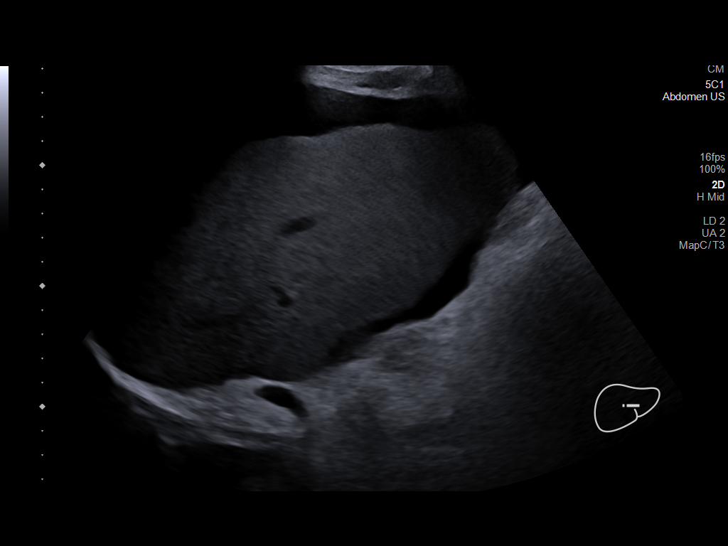
[im 51/101]
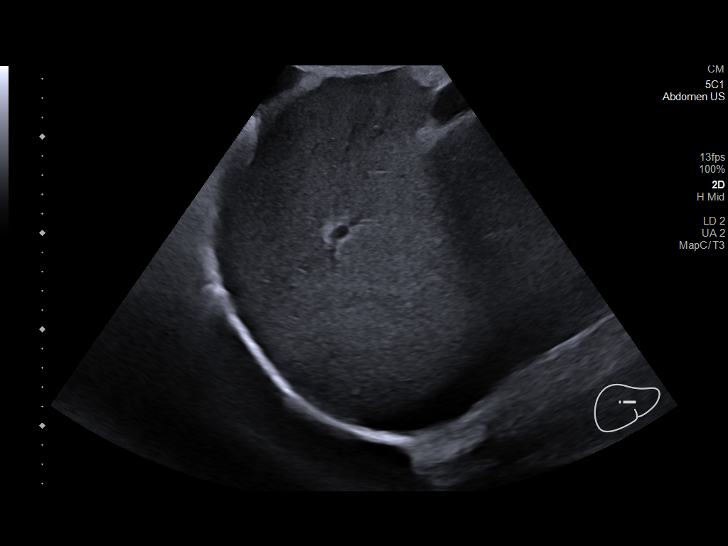
[im 59/101]
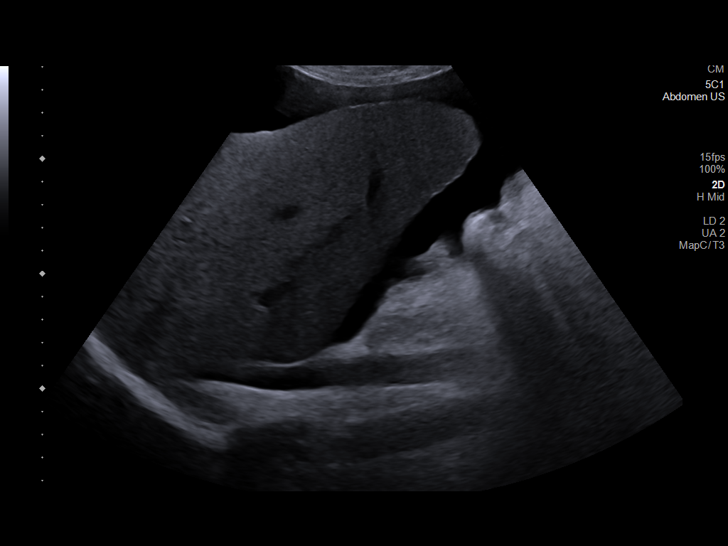
[im 67/101]
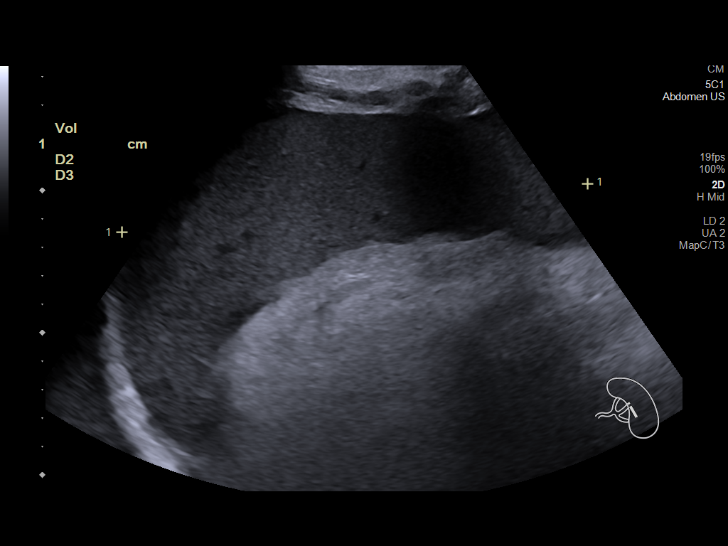
[im 76/101]
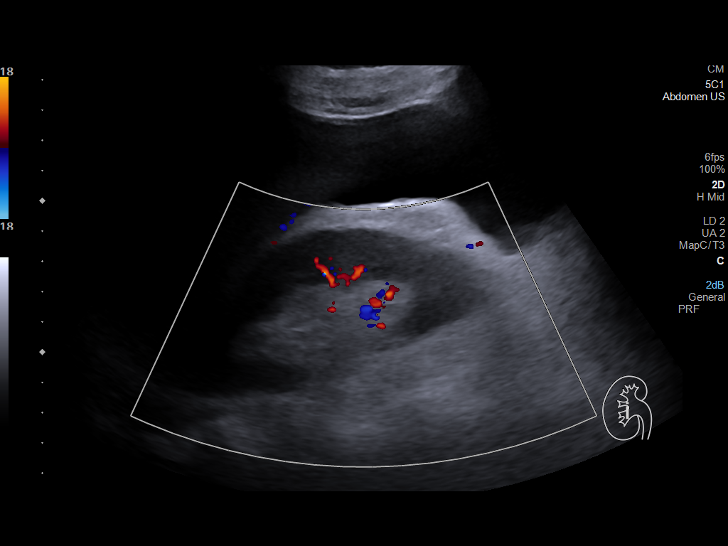
[im 84/101]
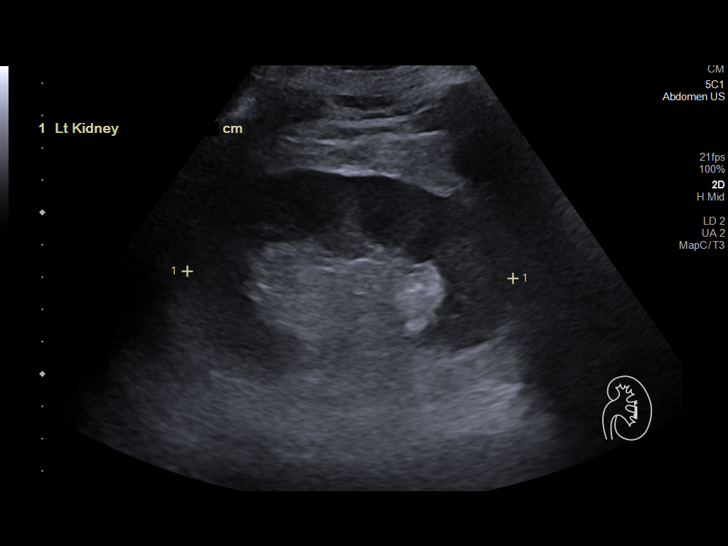
[im 92/101]
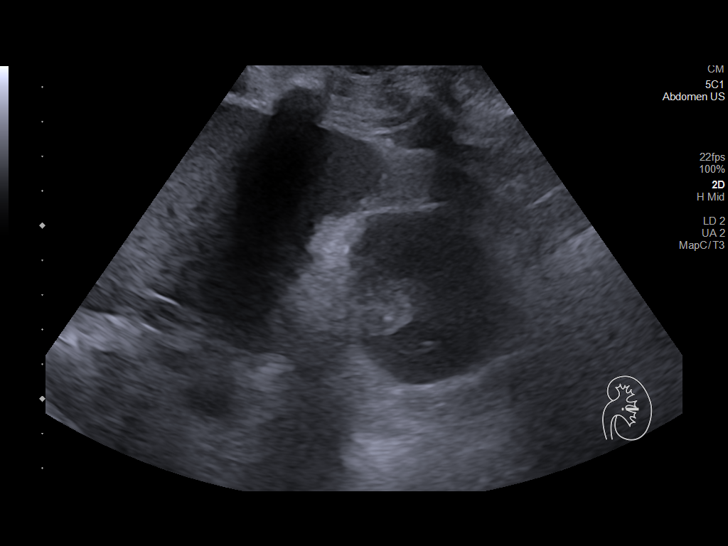
[im 101/101]
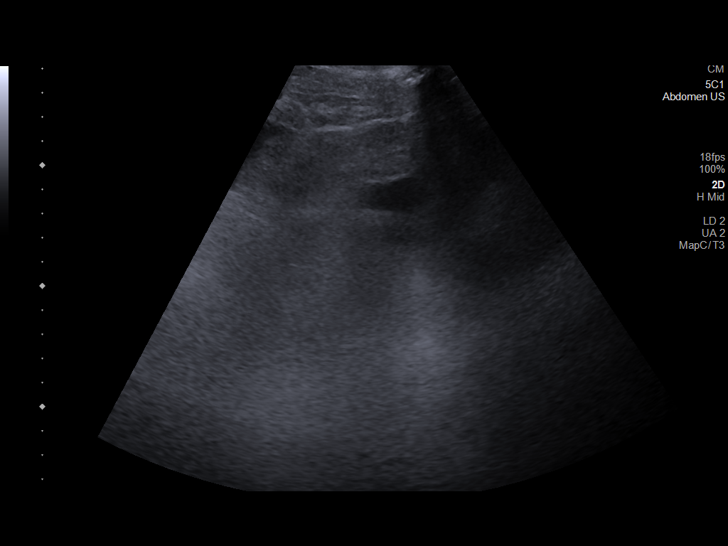

[13 of 25 positions shown; findings below may reference images not displayed]

FINDINGS: Gallbladder: No gallstones visualized. No sonographic Murphy sign
noted by sonographer. Moderate irregular wall thickening is noted
most likely due to adjacent hepatocellular disease and surrounding
ascites.

Common bile duct: Diameter: 4 mm which is within normal limits.

Liver: No focal lesion identified. Nodular hepatic contours are
noted consistent with hepatic cirrhosis. Portal vein is patent on
color Doppler imaging with normal direction of blood flow towards
the liver.

IVC: No abnormality visualized.

Pancreas: Not visualized.

Spleen: Measures 16.5 x 7.8 x 7.6 cm with calculated volume of 1,170
cc consistent with moderate splenomegaly. No focal abnormality is
noted.

Right Kidney: Length: 10.8 cm. Echogenicity within normal limits. No
mass or hydronephrosis visualized.

Left Kidney: Length: 10.1 cm. Echogenicity within normal limits. No
mass or hydronephrosis visualized.

Abdominal aorta: No aneurysm visualized.

Other findings: Mild to moderate ascites is noted.
IMPRESSION: Hepatic cirrhosis with moderate splenomegaly. Mild to moderate
ascites is noted. Pancreas is not visualized. No definite
cholelithiasis is noted.

## 2022-09-06 ENCOUNTER — Other Ambulatory Visit: Payer: Self-pay | Admitting: Family Medicine

## 2022-09-29 NOTE — Assessment & Plan Note (Signed)
-  likely secondary to liver cirrhosis and alcohol abuse. -presented with dizziness, low appetite, and abdominal discomfort on 08/26/21 and was hospitalized.  He was found to have liver cirrhosis, folate deficiency, moderate macrocytic anemia, and thrombocytopenia with platelet in 20-30K, no evidence of hemolysis.  This is felt to be related to his liver cirrhosis and alcohol abuse.  -He has stopped drinking alcohol since his recent hospital stay.  Lab reviewed, his anemia and thrombocytopenia has much improved, hemoglobin 11, platelet 85 K on September 07, 2021. -Continue folic acid -he has a family history of hemochromatosis in his sister. His hemochromatosis DNA mutation analysis was negative.

## 2022-09-30 ENCOUNTER — Inpatient Hospital Stay: Payer: 59 | Admitting: Hematology

## 2022-09-30 ENCOUNTER — Telehealth: Payer: Self-pay | Admitting: Hematology

## 2022-09-30 ENCOUNTER — Other Ambulatory Visit: Payer: 59

## 2022-09-30 DIAGNOSIS — D539 Nutritional anemia, unspecified: Secondary | ICD-10-CM

## 2022-09-30 NOTE — Progress Notes (Deleted)
[facility]   Telephone:(336) [phone] Fax:(336) [phone]   Clinic Follow up Note   Patient Care Team: [name], DO as PCP - General (Family Medicine)  Date of Service:  09/30/2022  I connected with Jamie Chavez on 09/30/2022 at 11:00 AM EDT by {Blank single:19197::"video enabled telemedicine visit","telephone visit"} and verified that I am speaking with the correct person using two identifiers.  I discussed the limitations, risks, security and privacy concerns of performing an evaluation and management service by telephone and the availability of in person appointments. I also discussed with the patient that there may be a patient responsible charge related to this service. The patient expressed understanding and agreed to proceed.   Other persons participating in the visit and their role in the encounter:  ***  Patient's location:  *** Provider's location:  ***  CHIEF COMPLAINT: f/u of  anemia and thrombocytopenia   CURRENT THERAPY:  Observation  ASSESSMENT & PLAN: *** Jamie Chavez is a 52 y.o. male with   ***   ***  Macrocytic anemia -likely secondary to liver cirrhosis and alcohol abuse. -presented with dizziness, low appetite, and abdominal discomfort on 08/26/21 and was hospitalized.  He was found to have liver cirrhosis, folate deficiency, moderate macrocytic anemia, and thrombocytopenia with platelet in 20-30K, no evidence of hemolysis.  This is felt to be related to his liver cirrhosis and alcohol abuse.  -He has stopped drinking alcohol since his recent hospital stay.  Lab reviewed, his anemia and thrombocytopenia has much improved, hemoglobin 11, platelet 85 K on September 07, 2021. -Continue folic acid -he has a family history of hemochromatosis in his sister. His hemochromatosis DNA mutation analysis was negative.     SUMMARY OF ONCOLOGIC HISTORY: Oncology History   No history exists.     INTERVAL HISTORY: *** Jamie Chavez was contacted for a  follow up of  anemia and thrombocytopenia . He was last seen by me on 10/02/2022.    All other systems were reviewed with the patient and are negative.  MEDICAL HISTORY:  Past Medical History:  Diagnosis Date   ETOH abuse    Drinks 23-4 beers + 5-6 shots of liquor per day   GERD (gastroesophageal reflux disease)    Hypertension     SURGICAL HISTORY: Past Surgical History:  Procedure Laterality Date   ARTHROSCOPIC REPAIR ACL     SHOULDER ARTHROSCOPY      I have reviewed the social history and family history with the patient and they are unchanged from previous note.  ALLERGIES:  has No Known Allergies.  MEDICATIONS:  Current Outpatient Medications  Medication Sig Dispense Refill   bismuth subsalicylate (PEPTO BISMOL) 262 MG chewable tablet Chew 2 tablets (524 mg total) by mouth as needed for indigestion. 30 tablet 0   famotidine (PEPCID) 20 MG tablet Take 1 tablet by mouth twice daily 60 tablet 0   folic acid (FOLVITE) 1 MG tablet Take 1 tablet (1 mg total) by mouth daily. 90 tablet 3   furosemide (LASIX) 40 MG tablet Take 40 mg by mouth daily.     Melatonin 10 MG TABS Take by mouth.     spironolactone (ALDACTONE) 100 MG tablet Take 100 mg by mouth daily.     thiamine 100 MG tablet Take 1 tablet (100 mg total) by mouth daily. 90 tablet 1   XIFAXAN 550 MG TABS tablet Take 1 tablet by mouth twice daily 180 tablet 0   No current facility-administered medications  for this visit.    PHYSICAL EXAMINATION: ECOG PERFORMANCE STATUS: {CHL ONC ECOG PS:[id number]}  There were no vitals filed for this visit. Wt Readings from Last 3 Encounters:  11/11/21 187 lb 3.2 oz (84.9 kg)  10/21/21 211 lb 3.2 oz (95.8 kg)  10/01/21 201 lb 1.6 oz (91.2 kg)    *** No vitals taken today, Exam not performed today  LABORATORY DATA:  I have reviewed the data as listed    Latest Ref Rng & Units 11/11/2021    2:54 PM 10/21/2021   11:23 AM 09/07/2021   12:21 PM  CBC  WBC 3.4 - 10.8 x10E3/uL 4.4   4.5  5.0   Hemoglobin 13.0 - 17.7 g/dL 69.6  29.5  28.4   Hematocrit 37.5 - 51.0 % 34.6  32.8  33.4   Platelets 150 - 450 x10E3/uL 82  63  85         Latest Ref Rng & Units 11/11/2021    2:54 PM 10/21/2021   11:23 AM 09/07/2021   12:21 PM  CMP  Glucose 70 - 99 mg/dL 132  440  102   BUN 6 - 24 mg/dL 8  9  21    Creatinine 0.76 - 1.27 mg/dL 7.25  3.66  4.40   Sodium 134 - 144 mmol/L 138  143  136   Potassium 3.5 - 5.2 mmol/L 3.7  3.9  4.1   Chloride 96 - 106 mmol/L 98  107  110   CO2 20 - 29 mmol/L 23  21  20    Calcium 8.7 - 10.2 mg/dL 9.4  9.1  9.1   Total Protein 6.0 - 8.5 g/dL 8.0  7.0  7.5   Total Bilirubin 0.0 - 1.2 mg/dL 3.6  3.2  6.2   Alkaline Phos 44 - 121 IU/L 78  82  80   AST 0 - 40 IU/L 69  70  173   ALT 0 - 44 IU/L 34  30  85       RADIOGRAPHIC STUDIES: I have personally reviewed the radiological images as listed and agreed with the findings in the report. No results found.    No orders of the defined types were placed in this encounter.  All questions were answered. The patient knows to call the clinic with any problems, questions or concerns. No barriers to learning was detected. The total time spent in the appointment was {CHL ONC TIME VISIT - [id number]}.     Salome Holmes, CMA 09/30/2022   Carolin Coy am acting as scribe for Malachy Mood, MD.   {Add scribe attestation statement}

## 2022-09-30 NOTE — Telephone Encounter (Signed)
Contacted patient to scheduled appointments. Left message with appointment details and a call back number if patient had any questions or could not accommodate the time we provided.   

## 2023-01-18 NOTE — Progress Notes (Signed)
This encounter was created in error - please disregard.
